# Patient Record
Sex: Male | Born: 1980 | Race: White | Hispanic: No | Marital: Married | Smoking: Never smoker
Health system: Southern US, Community
[De-identification: ages and names within clinical notes are randomized; demographics above are authoritative.]

## PROBLEM LIST (undated history)

## (undated) DIAGNOSIS — IMO0002 Reserved for concepts with insufficient information to code with codable children: Secondary | ICD-10-CM

## (undated) DIAGNOSIS — K219 Gastro-esophageal reflux disease without esophagitis: Secondary | ICD-10-CM

## (undated) DIAGNOSIS — I10 Essential (primary) hypertension: Secondary | ICD-10-CM

## (undated) HISTORY — DX: Gastro-esophageal reflux disease without esophagitis: K21.9

## (undated) HISTORY — DX: Essential (primary) hypertension: I10

## (undated) HISTORY — PX: OTHER SURGICAL HISTORY: SHX169

---

## 2011-07-29 ENCOUNTER — Encounter: Payer: Self-pay | Admitting: *Deleted

## 2013-12-14 ENCOUNTER — Encounter: Payer: Self-pay | Admitting: Cardiovascular Disease

## 2013-12-14 ENCOUNTER — Ambulatory Visit (INDEPENDENT_AMBULATORY_CARE_PROVIDER_SITE_OTHER): Payer: PRIVATE HEALTH INSURANCE | Admitting: Cardiovascular Disease

## 2013-12-14 VITALS — BP 138/82 | HR 89 | Ht 76.0 in | Wt 229.0 lb

## 2013-12-14 DIAGNOSIS — I10 Essential (primary) hypertension: Secondary | ICD-10-CM

## 2013-12-14 DIAGNOSIS — R0989 Other specified symptoms and signs involving the circulatory and respiratory systems: Secondary | ICD-10-CM

## 2013-12-14 DIAGNOSIS — R0600 Dyspnea, unspecified: Secondary | ICD-10-CM

## 2013-12-14 DIAGNOSIS — R072 Precordial pain: Secondary | ICD-10-CM

## 2013-12-14 DIAGNOSIS — R0609 Other forms of dyspnea: Secondary | ICD-10-CM

## 2013-12-14 NOTE — Patient Instructions (Signed)
Your physician recommends that you continue on your current medications as directed. Please refer to the Current Medication list given to you today.  Your physician has requested that you have an exercise tolerance test. For further information please visit [email or web address] Please also follow instruction sheet, as given.  Your physician has requested that you have an echocardiogram. Echocardiography is a painless test that uses sound waves to create images of your heart. It provides your doctor with information about the size and shape of your heart and how well your heart's chambers and valves are working. This procedure takes approximately one hour. There are no restrictions for this procedure.  Your physician recommends that you schedule a follow-up appointment in: 6-8 weeks with Dr Sanjuana Kava

## 2013-12-14 NOTE — Progress Notes (Signed)
     History of Present Illness: 33 yo male with history of HTN, GERD, and HLD here today as a new patient for evaluation of chest pain. He tells me that he had been feeling well until the beginning of July 2015. His BP was running high. He has been having pains in the center of his chest for the last 6 weeks. This occurs after meals and when lying in bed. He describes SOB when walking up stairs but no exertional chest pain. He has been started on a PPI with minimal improvement in chest pain. He has a strong family history of CAD in his father and grandfather.   Primary Care Physician: Larita Fife   Past Medical History  Diagnosis Date  . HTN (hypertension)   . GERD (gastroesophageal reflux disease)     Past Surgical History  Procedure Laterality Date  . None      Current Outpatient Prescriptions  Medication Sig Dispense Refill  . lisinopril-hydrochlorothiazide (ZESTORETIC) 20-12.5 MG per tablet Take 1 tablet by mouth.      Marland Kitchen omeprazole (PRILOSEC) 40 MG capsule Take 40 mg by mouth.      . zolpidem (AMBIEN) 10 MG tablet as needed.        No current facility-administered medications for this visit.    No Known Allergies  History   Social History  . Marital Status: Married    Spouse Name: N/A    Number of Children: 1  . Years of Education: N/A   Occupational History  . Sales    Social History Main Topics  . Smoking status: Never Smoker   . Smokeless tobacco: Not on file  . Alcohol Use: 8.4 oz/week    14 Cans of beer per week  . Drug Use: No  . Sexual Activity: Not on file   Other Topics Concern  . Not on file   Social History Narrative  . No narrative on file    Family History  Problem Relation Age of Onset  . Heart disease Father   . Heart attack Paternal Grandfather     Review of Systems:  As stated in the HPI and otherwise negative.   BP 138/82  Pulse 89  Ht  (1.93 m)  Wt 229 lb (103.874 kg)  BMI 27.89 kg/m2  SpO2 97%  Physical  Examination: General: Well developed, well nourished, NAD HEENT: OP clear, mucus membranes moist SKIN: warm, dry. No rashes. Neuro: No focal deficits Musculoskeletal: Muscle strength 5/5 all ext Psychiatric: Mood and affect normal Neck: No JVD, no carotid bruits, no thyromegaly, no lymphadenopathy. Lungs:Clear bilaterally, no wheezes, rhonci, crackles Cardiovascular: Regular rate and rhythm. No murmurs, gallops or rubs. Abdomen:Soft. Bowel sounds present. Non-tender.  Extremities: No lower extremity edema. Pulses are 2 + in the bilateral DP/PT.  EKG: NSR, rate 66 bpm.   Assessment and Plan:   1. Chest pain: He has a personal history of HTN and a strong family history of CAD. He is having daily chest pains that occur at rest and with exertion. He has had no improvement in his chest pain with addition of a proton pump inhibitor. Will arrange exercise stress myoview to exclude ischemia. Also having dyspnea with exertion (see below). If his cardiac workup is ok, will need referral to GI specialist.   2. Dyspnea: Lung exam clear. Will arrange echocardiogram to assess LV size and function, exclude valvular heart disease.   3. HTN: BP controlled on current therapy.

## 2013-12-17 ENCOUNTER — Ambulatory Visit (HOSPITAL_COMMUNITY): Payer: PRIVATE HEALTH INSURANCE | Attending: Cardiology

## 2013-12-17 DIAGNOSIS — R079 Chest pain, unspecified: Secondary | ICD-10-CM | POA: Diagnosis present

## 2013-12-17 DIAGNOSIS — E785 Hyperlipidemia, unspecified: Secondary | ICD-10-CM | POA: Diagnosis not present

## 2013-12-17 DIAGNOSIS — I10 Essential (primary) hypertension: Secondary | ICD-10-CM | POA: Diagnosis not present

## 2013-12-17 DIAGNOSIS — K219 Gastro-esophageal reflux disease without esophagitis: Secondary | ICD-10-CM | POA: Diagnosis not present

## 2013-12-17 DIAGNOSIS — R072 Precordial pain: Secondary | ICD-10-CM

## 2013-12-17 NOTE — Progress Notes (Signed)
2D Echo completed. 12/17/2013 

## 2013-12-20 ENCOUNTER — Telehealth: Payer: Self-pay | Admitting: *Deleted

## 2013-12-20 DIAGNOSIS — K219 Gastro-esophageal reflux disease without esophagitis: Secondary | ICD-10-CM

## 2013-12-20 NOTE — Telephone Encounter (Signed)
We can refer him to see Dr. Rhea Belton in [facility]. Thanks, chris

## 2013-12-20 NOTE — Telephone Encounter (Signed)
Spoke with pt and reviewed echo results with him. He states he talked with Dr. Clifton James about a GI referral. He is asking for recommendation and if we can make this referral for him.

## 2013-12-21 ENCOUNTER — Encounter: Payer: Self-pay | Admitting: Internal Medicine

## 2013-12-21 NOTE — Telephone Encounter (Signed)
Left message about referral on pt's identified voicemail. Left message to call back if questions. Referral put in EPIC.

## 2013-12-31 ENCOUNTER — Ambulatory Visit (INDEPENDENT_AMBULATORY_CARE_PROVIDER_SITE_OTHER): Payer: PRIVATE HEALTH INSURANCE

## 2013-12-31 DIAGNOSIS — R0609 Other forms of dyspnea: Secondary | ICD-10-CM

## 2013-12-31 DIAGNOSIS — R072 Precordial pain: Secondary | ICD-10-CM

## 2013-12-31 DIAGNOSIS — I10 Essential (primary) hypertension: Secondary | ICD-10-CM

## 2013-12-31 DIAGNOSIS — R0989 Other specified symptoms and signs involving the circulatory and respiratory systems: Secondary | ICD-10-CM

## 2013-12-31 DIAGNOSIS — R0600 Dyspnea, unspecified: Secondary | ICD-10-CM

## 2013-12-31 NOTE — Progress Notes (Signed)
Exercise Treadmill Test  Pre-Exercise Testing Evaluation Rhythm: normal sinus  Rate: 71 bpm     Test  Exercise Tolerance Test Ordering MD: Melene Muller, MD  Interpreting MD: Shea Evans, PA-C  Unique Test No: 1  Treadmill:  1  Indication for ETT: chest pain - rule out ischemia/SOB  Contraindication to ETT: No   Stress Modality: exercise - treadmill  Cardiac Imaging Performed: non   Protocol: standard Bruce - maximal  Max BP:  210/75  Max MPHR (bpm):  185 85% MPR (bpm):  157  MPHR obtained (bpm):  181 % MPHR obtained:  97  Reached 85% MPHR (min:sec):  7:15 Total Exercise Time (min-sec):  12:00  Workload in METS:  13.1 Borg Scale: 13  Reason ETT Terminated:  exaggerated hypertensive response    ST Segment Analysis At Rest: non-specific ST segment slurring With Exercise: no evidence of significant ST depression  Other Information Arrhythmia:  No significant arrhythmia- one PVC then one PVC couplet Angina during ETT:  absent (0) Quality of ETT:  diagnostic  ETT Interpretation:  normal - no evidence of ischemia by ST analysis  Comments: Normotensive at rest. Mild chest discomfort prior to test initiation. Patient is being treated for acid reflux. Excellent exercise capacity. Chest discomfort actually improved with exercise. No significant dyspnea. Test terminated due to persistence of elevated SBPs hovering in ~204-210 range. EKG without acute change.  Recommendations: ETT without evidence for ischemia. Excellent exercise capacity. Normotensive at rest before test (115/79). Hypertensive response to exercise, although this is the first time he has exercised in a month. Post-exercise blood pressure came back to normal quickly (127/77). Further plan per Dr. Clifton James.  Dayna Dunn PA-C

## 2014-02-17 ENCOUNTER — Ambulatory Visit: Payer: PRIVATE HEALTH INSURANCE | Admitting: Internal Medicine

## 2014-02-23 ENCOUNTER — Encounter: Payer: Self-pay | Admitting: Cardiovascular Disease

## 2014-02-23 ENCOUNTER — Ambulatory Visit (INDEPENDENT_AMBULATORY_CARE_PROVIDER_SITE_OTHER): Payer: PRIVATE HEALTH INSURANCE | Admitting: Cardiovascular Disease

## 2014-02-23 VITALS — BP 112/78 | HR 87 | Ht 76.0 in | Wt 231.2 lb

## 2014-02-23 DIAGNOSIS — R072 Precordial pain: Secondary | ICD-10-CM

## 2014-02-23 DIAGNOSIS — I10 Essential (primary) hypertension: Secondary | ICD-10-CM

## 2014-02-23 NOTE — Patient Instructions (Signed)
Your physician wants you to follow-up in:  12 months.  You will receive a reminder letter in the mail two months in advance. If you don't receive a letter, please call our office to schedule the follow-up appointment.   

## 2014-02-23 NOTE — Progress Notes (Signed)
History of Present Illness: 33 yo male with history of HTN, GERD, and HLD here today for cardiac follow up. I saw him as a new patient for evaluation of chest pain in August 2015. He tells me that he had been feeling well until the beginning of July 2015. His BP was running high. He described pains in the center of his chest that occurred after meals and when lying in bed. He described SOB when walking up stairs but no exertional chest pain. He has been started on a PPI with minimal improvement in chest pain. He has a strong family history of CAD in his father and grandfather. I arranged an echo and a treadmill stress test. Echo with normal LV function. Treadmill stress test with no evidence of ischemia after 12 minutes of exercise.   He is here today for follow up. He has been taking a PPI and feeling better. NO exertional chest pain.   Primary Care Physician: Larita Fifehan Badger   Past Medical History  Diagnosis Date  . HTN (hypertension)   . GERD (gastroesophageal reflux disease)     Past Surgical History  Procedure Laterality Date  . None      Current Outpatient Prescriptions  Medication Sig Dispense Refill  . DEXILANT 60 MG capsule 60 mg daily.   3  . lisinopril-hydrochlorothiazide (ZESTORETIC) 20-12.5 MG per tablet Take 1 tablet by mouth.    . ranitidine (ZANTAC) 150 MG capsule Take 150 mg by mouth 2 (two) times daily.     No current facility-administered medications for this visit.    No Known Allergies  History   Social History  . Marital Status: Married    Spouse Name: N/A    Number of Children: 1  . Years of Education: N/A   Occupational History  . Sales    Social History Main Topics  . Smoking status: Never Smoker   . Smokeless tobacco: Not on file  . Alcohol Use: 8.4 oz/week    14 Cans of beer per week  . Drug Use: No  . Sexual Activity: Not on file   Other Topics Concern  . Not on file   Social History Narrative    Family History  Problem Relation  Age of Onset  . Heart disease Father   . Heart attack Paternal Grandfather     Review of Systems:  As stated in the HPI and otherwise negative.   BP 112/78 mmHg  Pulse 87  Ht 6\' 4"  (1.93 m)  Wt 231 lb 3.2 oz (104.872 kg)  BMI 28.15 kg/m2  SpO2 96%  Physical Examination: General: Well developed, well nourished, NAD HEENT: OP clear, mucus membranes moist SKIN: warm, dry. No rashes. Neuro: No focal deficits Musculoskeletal: Muscle strength 5/5 all ext Psychiatric: Mood and affect normal Neck: No JVD, no carotid bruits, no thyromegaly, no lymphadenopathy. Lungs:Clear bilaterally, no wheezes, rhonci, crackles Cardiovascular: Regular rate and rhythm. No murmurs, gallops or rubs. Abdomen:Soft. Bowel sounds present. Non-tender.  Extremities: No lower extremity edema. Pulses are 2 + in the bilateral DP/PT.  Echo 12/17/13: Left ventricle: The cavity size was normal. Systolic function was normal. The estimated ejection fraction was in the range of 60% to 65%. Wall motion was normal; there were no regional wall motion abnormalities. - Pulmonary arteries: PA peak pressure: 35 mm Hg (S).  Assessment and Plan:   1. Chest pain: Atypical. Likely related to GERD. Stress test without ischemia. No further workup at this time.    2.  HTN: BP controlled on current therapy. No changes.

## 2014-03-29 ENCOUNTER — Encounter: Payer: Self-pay | Admitting: Internal Medicine

## 2014-05-18 ENCOUNTER — Ambulatory Visit (INDEPENDENT_AMBULATORY_CARE_PROVIDER_SITE_OTHER): Payer: PRIVATE HEALTH INSURANCE | Admitting: Internal Medicine

## 2014-05-18 ENCOUNTER — Encounter: Payer: Self-pay | Admitting: Internal Medicine

## 2014-05-18 VITALS — BP 132/84 | HR 80 | Ht 76.0 in | Wt 253.0 lb

## 2014-05-18 DIAGNOSIS — K219 Gastro-esophageal reflux disease without esophagitis: Secondary | ICD-10-CM

## 2014-05-18 DIAGNOSIS — R0789 Other chest pain: Secondary | ICD-10-CM

## 2014-05-18 NOTE — Progress Notes (Signed)
HISTORY OF PRESENT ILLNESS:  Edward Stanley is a 34 y.o. male who sent today by his primary provider regarding problems with chest complaints and acid reflux. The patient reports to me that he was in his usual state of good health until July 2015 when he developed problems with lightheadedness. He is known to be hypertensive. He required adjustment in his lisinopril for elevated blood pressure at that time. He subsequently began to notice pronounced throbbing of his heartbeat while laying night. He states "felt like my heart was coming out of my chest". He can also here the throbbing in his ear when lying on the pillow at night. He reports having been under stress at that time. In August he underwent cardiac evaluation which was negative. He was felt to have acid reflux and has been on some form of PPI since. For months, PPI did not help symptoms. Most recently spent on Dexilant 60 mg daily and ranitidine 150 mg at night. He states that his symptoms resolved. He recently ran out of Dexilant and 2 days later began to notice epigastric burning most pronounced after meals and at night. No recurrence of the chest pounding. In retrospect, he does state that he had minor symptoms of similar burning though less pronounced. He denies dysphagia, weight loss, or true abdominal pain. GI review of systems otherwise negative. He has refilled his Dexilant prescription  REVIEW OF SYSTEMS:  All non-GI ROS negative except for sleeping problems  Past Medical History  Diagnosis Date  . HTN (hypertension)   . GERD (gastroesophageal reflux disease)   . Prostate cancer   . Hyperlipidemia   . Anemia   . Encephalopathy   . Cirrhosis   . Cholelithiasis   . Osteoarthritis   . COPD (chronic obstructive pulmonary disease)   . STEMI (ST elevation myocardial infarction)     Past Surgical History  Procedure Laterality Date  . None      Social History Edward CumminsBradley Heckart  reports that he has never smoked. He does not have any  smokeless tobacco history on file. He reports that he drinks about 8.4 oz of alcohol per week. He reports that he does not use illicit drugs.  family history includes Breast cancer in his paternal grandmother; Heart attack in his paternal grandfather; Heart disease in his father; Prostate cancer in his paternal grandfather.  No Known Allergies     PHYSICAL EXAMINATION: Vital signs: BP 132/84 mmHg  Pulse 80  Ht 6\' 4"  (1.93 m)  Wt 253 lb (114.76 kg)  BMI 30.81 kg/m2 General: Well-developed, well-nourished, no acute distress HEENT: Sclerae are anicteric, conjunctiva pink. Oral mucosa intact Lungs: Clear Heart: Regular Abdomen: soft, nontender, nondistended, no obvious ascites, no peritoneal signs, normal bowel sounds. No organomegaly. Extremities: No edema Psychiatric: alert and oriented x3. Cooperative   ASSESSMENT:  #1. Initial complaints of chest pounding/throbbing. Noncardiac. Not reflux. #2. Probable underlying mild reflux based on history. More pronounced symptoms after withdrawal of prolonged PPI suggestive of acid rebound   PLAN:  #1. I discussed the above impression and detail #2. Interested in finding the lowest dose of medication to control true reflux symptoms. I've asked him to discontinue Zantac. He can alternate Dexilant 60 mg daily with Prilosec OTC 20 mg daily. Hopefully he can transition to Prilosec OTC 20 mg daily. Further reduction of acid suppression medication as tolerated. Maybe, that he does require some dose of acid suppression to control underlying reflux symptoms. No indication for endoscopy at this time. #3. Return to  clinic in 3 months for reevaluation. Contact the office in the interim for any questions or problems.

## 2014-05-18 NOTE — Patient Instructions (Signed)
Continue your PPI, give us a call to let us know how you are doing.  Please follow up with Dr. Marina GoodellPerry in 3 months

## 2014-07-04 ENCOUNTER — Other Ambulatory Visit (HOSPITAL_COMMUNITY): Payer: Self-pay | Admitting: Physician Assistant

## 2014-07-04 DIAGNOSIS — K219 Gastro-esophageal reflux disease without esophagitis: Secondary | ICD-10-CM

## 2014-07-11 ENCOUNTER — Ambulatory Visit (HOSPITAL_COMMUNITY)
Admission: RE | Admit: 2014-07-11 | Discharge: 2014-07-11 | Disposition: A | Discharge status: Home / Self Care | Payer: PRIVATE HEALTH INSURANCE | Source: Ambulatory Visit | Attending: Physician Assistant | Admitting: Physician Assistant

## 2014-07-11 DIAGNOSIS — K219 Gastro-esophageal reflux disease without esophagitis: Secondary | ICD-10-CM | POA: Diagnosis present

## 2014-07-11 DIAGNOSIS — K210 Gastro-esophageal reflux disease with esophagitis: Secondary | ICD-10-CM | POA: Insufficient documentation

## 2014-08-19 ENCOUNTER — Observation Stay (HOSPITAL_COMMUNITY): Payer: PRIVATE HEALTH INSURANCE | Admitting: Certified Registered"

## 2014-08-19 ENCOUNTER — Encounter (HOSPITAL_COMMUNITY): Admission: EM | Disposition: A | Discharge status: Home / Self Care | Payer: Self-pay | Source: Home / Self Care

## 2014-08-19 ENCOUNTER — Observation Stay (HOSPITAL_COMMUNITY)
Admission: EM | Admit: 2014-08-19 | Discharge: 2014-08-19 | Disposition: A | Discharge status: Home / Self Care | Payer: PRIVATE HEALTH INSURANCE | Attending: Surgery | Admitting: Surgery

## 2014-08-19 ENCOUNTER — Encounter (HOSPITAL_COMMUNITY): Payer: Self-pay | Admitting: Emergency Medicine

## 2014-08-19 ENCOUNTER — Emergency Department (HOSPITAL_COMMUNITY): Payer: PRIVATE HEALTH INSURANCE

## 2014-08-19 DIAGNOSIS — K219 Gastro-esophageal reflux disease without esophagitis: Secondary | ICD-10-CM | POA: Diagnosis not present

## 2014-08-19 DIAGNOSIS — R1031 Right lower quadrant pain: Secondary | ICD-10-CM

## 2014-08-19 DIAGNOSIS — K3580 Unspecified acute appendicitis: Principal | ICD-10-CM | POA: Insufficient documentation

## 2014-08-19 DIAGNOSIS — K37 Unspecified appendicitis: Secondary | ICD-10-CM | POA: Diagnosis present

## 2014-08-19 DIAGNOSIS — I10 Essential (primary) hypertension: Secondary | ICD-10-CM | POA: Diagnosis not present

## 2014-08-19 HISTORY — PX: LAPAROSCOPIC APPENDECTOMY: SHX408

## 2014-08-19 LAB — CBC WITH DIFFERENTIAL/PLATELET
Basophils Absolute: 0 10*3/uL (ref 0.0–0.1)
Basophils Relative: 0 % (ref 0–1)
Eosinophils Absolute: 0.1 10*3/uL (ref 0.0–0.7)
Eosinophils Relative: 1 % (ref 0–5)
HCT: 39.2 % (ref 39.0–52.0)
Hemoglobin: 13.7 g/dL (ref 13.0–17.0)
Lymphocytes Relative: 24 % (ref 12–46)
Lymphs Abs: 1.9 10*3/uL (ref 0.7–4.0)
MCH: 29.2 pg (ref 26.0–34.0)
MCHC: 34.9 g/dL (ref 30.0–36.0)
MCV: 83.6 fL (ref 78.0–100.0)
MONO ABS: 0.7 10*3/uL (ref 0.1–1.0)
Monocytes Relative: 8 % (ref 3–12)
Neutro Abs: 5.2 10*3/uL (ref 1.7–7.7)
Neutrophils Relative %: 67 % (ref 43–77)
Platelets: 192 10*3/uL (ref 150–400)
RBC: 4.69 MIL/uL (ref 4.22–5.81)
RDW: 12.3 % (ref 11.5–15.5)
WBC: 7.8 10*3/uL (ref 4.0–10.5)

## 2014-08-19 LAB — COMPREHENSIVE METABOLIC PANEL
ALK PHOS: 60 U/L (ref 39–117)
ALT: 25 U/L (ref 0–53)
AST: 20 U/L (ref 0–37)
Albumin: 4.2 g/dL (ref 3.5–5.2)
Anion gap: 10 (ref 5–15)
BUN: 20 mg/dL (ref 6–23)
CALCIUM: 9.3 mg/dL (ref 8.4–10.5)
CO2: 24 mmol/L (ref 19–32)
Chloride: 104 mmol/L (ref 96–112)
Creatinine, Ser: 0.95 mg/dL (ref 0.50–1.35)
GFR calc Af Amer: >90 mL/min (ref >90)
Glucose, Bld: 109 mg/dL — ABNORMAL HIGH (ref 70–99)
Potassium: 3.9 mmol/L (ref 3.5–5.1)
SODIUM: 138 mmol/L (ref 135–145)
Total Bilirubin: 0.8 mg/dL (ref 0.3–1.2)
Total Protein: 6.8 g/dL (ref 6.0–8.3)

## 2014-08-19 SURGERY — APPENDECTOMY, LAPAROSCOPIC
Anesthesia: General | Site: Abdomen

## 2014-08-19 MED ORDER — ONDANSETRON HCL 4 MG/2ML IJ SOLN
INTRAMUSCULAR | Status: AC
  Filled 2014-08-19: qty 2

## 2014-08-19 MED ORDER — ENOXAPARIN SODIUM 40 MG/0.4ML ~~LOC~~ SOLN: 40.0000 mg | SUBCUTANEOUS | Status: DC

## 2014-08-19 MED ORDER — GLYCOPYRROLATE 0.2 MG/ML IJ SOLN
INTRAMUSCULAR | Status: AC
  Filled 2014-08-19: qty 2

## 2014-08-19 MED ORDER — HYDROMORPHONE HCL 1 MG/ML IJ SOLN
0.2500 mg | INTRAMUSCULAR | Status: DC | PRN
  Administered 2014-08-19 (×4): 0.5 mg via INTRAVENOUS

## 2014-08-19 MED ORDER — KCL IN DEXTROSE-NACL 20-5-0.9 MEQ/L-%-% IV SOLN
INTRAVENOUS | Status: DC
  Administered 2014-08-19: 12:00:00 via INTRAVENOUS
  Filled 2014-08-19 (×3): qty 1000

## 2014-08-19 MED ORDER — IOHEXOL 300 MG/ML  SOLN
25.0000 mL | Freq: Once | INTRAMUSCULAR | Status: AC | PRN
  Administered 2014-08-19: 25 mL via ORAL

## 2014-08-19 MED ORDER — HYDROCODONE-ACETAMINOPHEN 5-325 MG PO TABS
1.0000 | ORAL_TABLET | ORAL | Status: DC | PRN
  Administered 2014-08-19 (×2): 2 via ORAL
  Filled 2014-08-19 (×2): qty 2

## 2014-08-19 MED ORDER — ROCURONIUM BROMIDE 100 MG/10ML IV SOLN
INTRAVENOUS | Status: DC | PRN
  Administered 2014-08-19: 30 mg via INTRAVENOUS

## 2014-08-19 MED ORDER — MIDAZOLAM HCL 2 MG/2ML IJ SOLN
INTRAMUSCULAR | Status: AC
  Filled 2014-08-19: qty 2

## 2014-08-19 MED ORDER — LIDOCAINE HCL (CARDIAC) 20 MG/ML IV SOLN
INTRAVENOUS | Status: DC | PRN
  Administered 2014-08-19: 80 mg via INTRAVENOUS

## 2014-08-19 MED ORDER — LISINOPRIL 20 MG PO TABS
20.0000 mg | ORAL_TABLET | Freq: Every day | ORAL | Status: DC
  Filled 2014-08-19: qty 1

## 2014-08-19 MED ORDER — NEOSTIGMINE METHYLSULFATE 10 MG/10ML IV SOLN
INTRAVENOUS | Status: AC
  Filled 2014-08-19: qty 1

## 2014-08-19 MED ORDER — PROPOFOL 10 MG/ML IV BOLUS
INTRAVENOUS | Status: DC | PRN
  Administered 2014-08-19: 180 mg via INTRAVENOUS
  Administered 2014-08-19: 20 mg via INTRAVENOUS

## 2014-08-19 MED ORDER — PANTOPRAZOLE SODIUM 40 MG PO TBEC
40.0000 mg | DELAYED_RELEASE_TABLET | Freq: Every day | ORAL | Status: DC
  Administered 2014-08-19: 40 mg via ORAL
  Filled 2014-08-19 (×2): qty 1

## 2014-08-19 MED ORDER — FENTANYL CITRATE (PF) 250 MCG/5ML IJ SOLN
INTRAMUSCULAR | Status: AC
  Filled 2014-08-19: qty 5

## 2014-08-19 MED ORDER — HYDROMORPHONE HCL 1 MG/ML IJ SOLN
INTRAMUSCULAR | Status: AC
  Filled 2014-08-19: qty 1

## 2014-08-19 MED ORDER — NEOSTIGMINE METHYLSULFATE 10 MG/10ML IV SOLN
INTRAVENOUS | Status: DC | PRN
  Administered 2014-08-19: 3 mg via INTRAVENOUS

## 2014-08-19 MED ORDER — DEXAMETHASONE SODIUM PHOSPHATE 4 MG/ML IJ SOLN
INTRAMUSCULAR | Status: AC
  Filled 2014-08-19: qty 2

## 2014-08-19 MED ORDER — LISINOPRIL-HYDROCHLOROTHIAZIDE 20-12.5 MG PO TABS: 1.0000 | ORAL_TABLET | Freq: Every day | ORAL | Status: DC

## 2014-08-19 MED ORDER — MIDAZOLAM HCL 5 MG/5ML IJ SOLN
INTRAMUSCULAR | Status: DC | PRN
  Administered 2014-08-19: 2 mg via INTRAVENOUS

## 2014-08-19 MED ORDER — LIDOCAINE HCL (CARDIAC) 20 MG/ML IV SOLN
INTRAVENOUS | Status: AC
Start: 2014-08-19 — End: 2014-08-19
  Filled 2014-08-19: qty 5

## 2014-08-19 MED ORDER — BUPIVACAINE-EPINEPHRINE 0.25% -1:200000 IJ SOLN
INTRAMUSCULAR | Status: DC | PRN
  Administered 2014-08-19: 6 mL

## 2014-08-19 MED ORDER — FAMOTIDINE 20 MG PO TABS
20.0000 mg | ORAL_TABLET | Freq: Every day | ORAL | Status: DC | PRN
Start: 2014-08-19 — End: 2014-08-19

## 2014-08-19 MED ORDER — IOHEXOL 300 MG/ML  SOLN
80.0000 mL | Freq: Once | INTRAMUSCULAR | Status: AC | PRN
  Administered 2014-08-19: 80 mL via INTRAVENOUS

## 2014-08-19 MED ORDER — SUCCINYLCHOLINE CHLORIDE 20 MG/ML IJ SOLN
INTRAMUSCULAR | Status: DC | PRN
  Administered 2014-08-19: 40 mg via INTRAVENOUS

## 2014-08-19 MED ORDER — ACETAMINOPHEN 325 MG PO TABS
650.0000 mg | ORAL_TABLET | Freq: Four times a day (QID) | ORAL | Status: DC | PRN
Start: 2014-08-19 — End: 2014-08-19

## 2014-08-19 MED ORDER — METRONIDAZOLE IN NACL 5-0.79 MG/ML-% IV SOLN
500.0000 mg | Freq: Three times a day (TID) | INTRAVENOUS | Status: DC
  Administered 2014-08-19: 500 mg via INTRAVENOUS
  Filled 2014-08-19 (×3): qty 100

## 2014-08-19 MED ORDER — GLYCOPYRROLATE 0.2 MG/ML IJ SOLN
INTRAMUSCULAR | Status: DC | PRN
  Administered 2014-08-19: 0.4 mg via INTRAVENOUS

## 2014-08-19 MED ORDER — HYDROCODONE-ACETAMINOPHEN 5-325 MG PO TABS: 1.0000 | ORAL_TABLET | Freq: Four times a day (QID) | ORAL | Status: DC | PRN

## 2014-08-19 MED ORDER — LACTATED RINGERS IV SOLN
INTRAVENOUS | Status: DC | PRN
  Administered 2014-08-19 (×2): via INTRAVENOUS

## 2014-08-19 MED ORDER — SODIUM CHLORIDE 0.9 % IR SOLN
Status: DC | PRN
  Administered 2014-08-19: 1000 mL

## 2014-08-19 MED ORDER — PROPOFOL 10 MG/ML IV BOLUS
INTRAVENOUS | Status: AC
  Filled 2014-08-19: qty 20

## 2014-08-19 MED ORDER — BUPIVACAINE-EPINEPHRINE (PF) 0.25% -1:200000 IJ SOLN
INTRAMUSCULAR | Status: AC
  Filled 2014-08-19: qty 30

## 2014-08-19 MED ORDER — ACETAMINOPHEN 650 MG RE SUPP: 650.0000 mg | Freq: Four times a day (QID) | RECTAL | Status: DC | PRN

## 2014-08-19 MED ORDER — DEXAMETHASONE SODIUM PHOSPHATE 4 MG/ML IJ SOLN
INTRAMUSCULAR | Status: DC | PRN
  Administered 2014-08-19: 8 mg via INTRAVENOUS

## 2014-08-19 MED ORDER — ONDANSETRON HCL 4 MG/2ML IJ SOLN
INTRAMUSCULAR | Status: DC | PRN
  Administered 2014-08-19: 4 mg via INTRAVENOUS

## 2014-08-19 MED ORDER — FENTANYL CITRATE (PF) 100 MCG/2ML IJ SOLN
INTRAMUSCULAR | Status: DC | PRN
  Administered 2014-08-19 (×2): 50 ug via INTRAVENOUS
  Administered 2014-08-19: 150 ug via INTRAVENOUS

## 2014-08-19 MED ORDER — DEXTROSE 5 % IV SOLN
2.0000 g | INTRAVENOUS | Status: DC
  Administered 2014-08-19: 2 g via INTRAVENOUS
  Filled 2014-08-19: qty 2

## 2014-08-19 MED ORDER — HYDROCHLOROTHIAZIDE 12.5 MG PO CAPS
12.5000 mg | ORAL_CAPSULE | Freq: Every day | ORAL | Status: DC
  Filled 2014-08-19: qty 1

## 2014-08-19 MED ORDER — ONDANSETRON HCL 4 MG/2ML IJ SOLN: 4.0000 mg | Freq: Four times a day (QID) | INTRAMUSCULAR | Status: DC | PRN

## 2014-08-19 MED ORDER — 0.9 % SODIUM CHLORIDE (POUR BTL) OPTIME
TOPICAL | Status: DC | PRN
  Administered 2014-08-19: 1000 mL

## 2014-08-19 MED ORDER — MORPHINE SULFATE 2 MG/ML IJ SOLN
2.0000 mg | INTRAMUSCULAR | Status: DC | PRN
Start: 2014-08-19 — End: 2014-08-19

## 2014-08-19 SURGICAL SUPPLY — 43 items
APPLIER CLIP ROT 10 11.4 M/L (STAPLE)
BENZOIN TINCTURE PRP APPL 2/3 (GAUZE/BANDAGES/DRESSINGS) ×3 IMPLANT
BLADE SURG ROTATE 9660 (MISCELLANEOUS) ×3 IMPLANT
CANISTER SUCTION 2500CC (MISCELLANEOUS) ×3 IMPLANT
CHLORAPREP W/TINT 26ML (MISCELLANEOUS) ×3 IMPLANT
CLIP APPLIE ROT 10 11.4 M/L (STAPLE) IMPLANT
CLOSURE WOUND 1/2 X4 (GAUZE/BANDAGES/DRESSINGS) ×1
COVER SURGICAL LIGHT HANDLE (MISCELLANEOUS) ×3 IMPLANT
CUTTER FLEX LINEAR 45M (STAPLE) ×3 IMPLANT
DRAPE LAPAROSCOPIC ABDOMINAL (DRAPES) ×3 IMPLANT
DRSG TEGADERM 2-3/8X2-3/4 SM (GAUZE/BANDAGES/DRESSINGS) ×6 IMPLANT
DRSG TEGADERM 4X4.75 (GAUZE/BANDAGES/DRESSINGS) ×3 IMPLANT
ELECT REM PT RETURN 9FT ADLT (ELECTROSURGICAL) ×3
ELECTRODE REM PT RTRN 9FT ADLT (ELECTROSURGICAL) ×1 IMPLANT
ENDOLOOP SUT PDS II  0 18 (SUTURE)
ENDOLOOP SUT PDS II 0 18 (SUTURE) IMPLANT
FILTER SMOKE EVAC LAPAROSHD (FILTER) ×3 IMPLANT
GAUZE SPONGE 2X2 8PLY STRL LF (GAUZE/BANDAGES/DRESSINGS) ×1 IMPLANT
GLOVE BIO SURGEON STRL SZ7 (GLOVE) ×3 IMPLANT
GLOVE BIOGEL PI IND STRL 7.5 (GLOVE) ×1 IMPLANT
GLOVE BIOGEL PI INDICATOR 7.5 (GLOVE) ×2
GOWN STRL REUS W/ TWL LRG LVL3 (GOWN DISPOSABLE) ×2 IMPLANT
GOWN STRL REUS W/ TWL XL LVL3 (GOWN DISPOSABLE) ×1 IMPLANT
GOWN STRL REUS W/TWL LRG LVL3 (GOWN DISPOSABLE) ×4
GOWN STRL REUS W/TWL XL LVL3 (GOWN DISPOSABLE) ×2
KIT BASIN OR (CUSTOM PROCEDURE TRAY) ×3 IMPLANT
KIT ROOM TURNOVER OR (KITS) ×3 IMPLANT
NS IRRIG 1000ML POUR BTL (IV SOLUTION) ×3 IMPLANT
PAD ARMBOARD 7.5X6 YLW CONV (MISCELLANEOUS) ×3 IMPLANT
POUCH SPECIMEN RETRIEVAL 10MM (ENDOMECHANICALS) ×3 IMPLANT
RELOAD STAPLE TA45 3.5 REG BLU (ENDOMECHANICALS) ×3 IMPLANT
SCALPEL HARMONIC ACE (MISCELLANEOUS) ×3 IMPLANT
SET IRRIG TUBING LAPAROSCOPIC (IRRIGATION / IRRIGATOR) ×3 IMPLANT
SPECIMEN JAR SMALL (MISCELLANEOUS) ×3 IMPLANT
SPONGE GAUZE 2X2 STER 10/PKG (GAUZE/BANDAGES/DRESSINGS) ×2
STRIP CLOSURE SKIN 1/2X4 (GAUZE/BANDAGES/DRESSINGS) ×2 IMPLANT
SUT MNCRL AB 4-0 PS2 18 (SUTURE) ×3 IMPLANT
TOWEL OR 17X24 6PK STRL BLUE (TOWEL DISPOSABLE) ×3 IMPLANT
TOWEL OR 17X26 10 PK STRL BLUE (TOWEL DISPOSABLE) ×3 IMPLANT
TRAY LAPAROSCOPIC (CUSTOM PROCEDURE TRAY) ×3 IMPLANT
TROCAR XCEL BLADELESS 5X75MML (TROCAR) ×6 IMPLANT
TROCAR XCEL BLUNT TIP 100MML (ENDOMECHANICALS) ×3 IMPLANT
TUBING INSUFFLATION (TUBING) ×3 IMPLANT

## 2014-08-19 NOTE — Op Note (Signed)
Appendectomy, Lap, Procedure Note  Indications: The patient presented with a one-day history of right-sided abdominal pain. A CT scan revealed findings consistent with acute appendicitis.  Pre-operative Diagnosis: Acute appendicitis without mention of peritonitis  Post-operative Diagnosis: Same  Surgeon: Avelynn Sellin K.   Assistants: none  Anesthesia: General endotracheal anesthesia  ASA Class: 1E  Procedure Details  The patient was seen again in the Holding Room. The risks, benefits, complications, treatment options, and expected outcomes were discussed with the patient and/or family. The possibilities of reaction to medication, perforation of viscus, bleeding, recurrent infection, finding a normal appendix, the need for additional procedures, failure to diagnose a condition, and creating a complication requiring transfusion or operation were discussed. There was concurrence with the proposed plan and informed consent was obtained. The site of surgery was properly noted. The patient was taken to Operating Room, identified as Verdon CumminsBradley Alldredge and the procedure verified as Appendectomy. A Time Out was held and the above information confirmed.  The patient was placed in the supine position and general anesthesia was induced.  The abdomen was prepped and draped in a sterile fashion. A one centimeter infraumbilical incision was made.  Dissection was carried down to the fascia bluntly.  The fascia was incised vertically.  We entered the peritoneal cavity bluntly.  A pursestring suture was passed around the incision with a 0 Vicryl.  The Hasson cannula was introduced into the abdomen and the tails of the suture were used to hold the Hasson in place.   The pneumoperitoneum was then established maintaining a maximum pressure of 15 mmHg.  Additional 5 mm cannulas then placed in the left lower quadrant of the abdomen and the right upper quadrant under direct visualization. A careful evaluation of the entire  abdomen was carried out. The patient was placed in Trendelenburg and left lateral decubitus position.  The scope was moved to the right upper quadrant port site. The cecum was mobilized medially.  The appendix was inflamed and thickened, but there was no sign of perforation or abscess.  The appendix was carefully dissected. The appendix was was skeletonized with the harmonic scalpel.   The appendix was divided at its base using an endo-GIA stapler. Minimal appendiceal stump was left in place. There was no evidence of bleeding, leakage, or complication after division of the appendix. Irrigation was also performed and irrigate suctioned from the abdomen as well.  The umbilical port site was closed with the purse string suture. There was no residual palpable fascial defect.  The trocar site skin wounds were closed with 4-0 Monocryl.  Instrument, sponge, and needle counts were correct at the conclusion of the case.   Findings: The appendix was found to be inflamed. There were not signs of necrosis.  There was not perforation. There was not abscess formation.  Estimated Blood Loss:  Minimal         Drains: none         Specimens: appendix         Complications:  None; patient tolerated the procedure well.         Disposition: PACU - hemodynamically stable.         Condition: stable  Wilmon ArmsMatthew K. Corliss Skainssuei, MD, [facility]  General/ Trauma Surgery  08/19/2014 2:22 PM

## 2014-08-19 NOTE — Anesthesia Postprocedure Evaluation (Signed)
  Anesthesia Post-op Note  Patient: Edward Stanley  Procedure(s) Performed: Procedure(s): APPENDECTOMY LAPAROSCOPIC (N/A)  Patient Location: PACU  Anesthesia Type:General  Level of Consciousness: awake  Airway and Oxygen Therapy: Patient Spontanous Breathing  Post-op Pain: mild  Post-op Assessment: Post-op Vital signs reviewed, Patient's Cardiovascular Status Stable, Respiratory Function Stable, Patent Airway, No signs of Nausea or vomiting and Pain level controlled  Post-op Vital Signs: Reviewed and stable  Last Vitals:  Filed Vitals:   08/19/14 1557  BP: 126/71  Pulse: 61  Temp: 36.9 C  Resp: 15    Complications: No apparent anesthesia complications

## 2014-08-19 NOTE — Anesthesia Preprocedure Evaluation (Addendum)
Anesthesia Evaluation  Patient identified by MRN, date of birth, ID band Patient awake    Reviewed: Allergy & Precautions, H&P , NPO status , Patient's Chart, lab work & pertinent test results  Airway Mallampati: II  TM Distance: >3 FB Neck ROM: Full    Dental no notable dental hx. (+) Teeth Intact, Dental Advisory Given   Pulmonary neg pulmonary ROS,  breath sounds clear to auscultation  Pulmonary exam normal       Cardiovascular hypertension, Pt. on medications Rhythm:Regular Rate:Normal     Neuro/Psych negative neurological ROS  negative psych ROS   GI/Hepatic Neg liver ROS, GERD-  Medicated and Controlled,  Endo/Other  negative endocrine ROS  Renal/GU negative Renal ROS  negative genitourinary   Musculoskeletal   Abdominal   Peds  Hematology negative hematology ROS (+)   Anesthesia Other Findings   Reproductive/Obstetrics negative OB ROS                            Anesthesia Physical Anesthesia Plan  ASA: II and emergent  Anesthesia Plan: General   Post-op Pain Management:    Induction: Intravenous, Rapid sequence and Cricoid pressure planned  Airway Management Planned: Oral ETT  Additional Equipment:   Intra-op Plan:   Post-operative Plan: Extubation in OR  Informed Consent: I have reviewed the patients History and Physical, chart, labs and discussed the procedure including the risks, benefits and alternatives for the proposed anesthesia with the patient or authorized representative who has indicated his/her understanding and acceptance.   Dental advisory given  Plan Discussed with: CRNA  Anesthesia Plan Comments:         Anesthesia Quick Evaluation

## 2014-08-19 NOTE — Progress Notes (Signed)
Family has all belongings.  

## 2014-08-19 NOTE — ED Provider Notes (Signed)
CSN: [id number]     Arrival date & time 08/19/14  0708 History   None    Chief Complaint  Patient presents with  . Abdominal Pain  . Emesis     (Consider location/radiation/quality/duration/timing/severity/associated sxs/prior Treatment) Patient is a 34 y.o. male presenting with abdominal pain and vomiting. The history is provided by the patient. No language interpreter was used.  Abdominal Pain Pain location:  RLQ Pain quality: aching   Pain radiates to:  Does not radiate Pain severity:  Moderate Onset quality:  Gradual Duration:  1 day Timing:  Constant Progression:  Worsening Chronicity:  New Context: not recent illness   Relieved by:  Nothing Worsened by:  Nothing tried Ineffective treatments:  None tried Associated symptoms: vomiting   Associated symptoms: no fever and no nausea   Risk factors: has not had multiple surgeries and no recent hospitalization   Emesis Associated symptoms: abdominal pain   Pt reports sorenss right lower quadrant.  Pt woried that he has appendicitis   Pt's medical and social history that pulled up from old note is incorrect.    This pt has a history of GERD and HTN only  Past Surgical History  Procedure Laterality Date  . None     Family History  Problem Relation Age of Onset  . Heart disease Father   . Heart attack Paternal Grandfather   . Breast cancer Paternal Grandmother   . Prostate cancer Paternal Grandfather      Review of Systems  Constitutional: Negative for fever.  Gastrointestinal: Positive for vomiting and abdominal pain. Negative for nausea.  All other systems reviewed and are negative.     Allergies  Review of patient's allergies indicates no known allergies.  Home Medications   Prior to Admission medications   Medication Sig Start Date End Date Taking? Authorizing Provider  DEXILANT 60 MG capsule Take 60 mg by mouth daily.  02/10/14  Yes Historical Provider, MD  lisinopril-hydrochlorothiazide (ZESTORETIC)  20-12.5 MG per tablet Take 1 tablet by mouth. 11/23/13 11/23/14 Yes Historical Provider, MD  ranitidine (ZANTAC) 150 MG capsule Take 150 mg by mouth daily as needed for heartburn.    Yes Historical Provider, MD   BP 117/71 mmHg  Pulse 59  Temp(Src) 97.9 F (36.6 C) (Oral)  Resp 18  Ht 6' 3.5" (1.918 m)  Wt 245 lb (111.131 kg)  BMI 30.21 kg/m2  SpO2 100% Physical Exam  Constitutional: He is oriented to person, place, and time. He appears well-developed and well-nourished.  HENT:  Head: Normocephalic and atraumatic.  Right Ear: External ear normal.  Left Ear: External ear normal.  Mouth/Throat: Oropharynx is clear and moist.  Eyes: Pupils are equal, round, and reactive to light.  Neck: Normal range of motion.  Cardiovascular: Normal rate.   Pulmonary/Chest: Effort normal.  Abdominal: Soft. There is tenderness. There is no rebound and no guarding.  Musculoskeletal: Normal range of motion.  Neurological: He is alert and oriented to person, place, and time.  Skin: Skin is warm.  Psychiatric: He has a normal mood and affect.  Vitals reviewed.   ED Course  Procedures (including critical care time) Labs Review Labs Reviewed  COMPREHENSIVE METABOLIC PANEL - Abnormal; Notable for the following:    Glucose, Bld 109 (*)    All other components within normal limits  CBC WITH DIFFERENTIAL/PLATELET    Imaging Review Ct Abdomen Pelvis W Contrast  08/19/2014   CLINICAL DATA:  Right lower quadrant abdominal pain with vomiting beginning yesterday. Low back  pain. Gastroesophageal reflux.  EXAM: CT ABDOMEN AND PELVIS WITH CONTRAST  TECHNIQUE: Multidetector CT imaging of the abdomen and pelvis was performed using the standard protocol following bolus administration of intravenous contrast.  CONTRAST:  80mL OMNIPAQUE IOHEXOL 300 MG/ML  SOLN  COMPARISON:  None.  FINDINGS: Minimal dependent atelectasis is present in the lung bases. No pleural effusion.  The liver, gallbladder, spleen, adrenal glands,  kidneys, and pancreas have an unremarkable enhanced appearance.  Oral contrast is present in nondilated loops of distal small bowel and large bowel to the level of the ascending colon. There is no evidence of obstruction. Appendix is mildly enlarged near its base, measuring 10 mm in diameter, with evidence of mild wall thickening, hyperenhancement, and very mild periappendiceal inflammatory changes. Appendix is retrocecal in location. There is no evidence of perforation or fluid collection. There are an increased number of small right lower quadrant lymph nodes measuring up to 5 mm in short axis.  Bladder is unremarkable. No intraperitoneal free fluid is seen. No acute osseous abnormality is identified.  IMPRESSION: Acute appendicitis without evidence of perforation.  These results were called by telephone at the time of interpretation on 08/19/2014 at 10:11 am to [facility], PA , who verbally acknowledged these results.   Electronically Signed   By: Sebastian Ache   On: 08/19/2014 10:12     EKG Interpretation None      MDM   Final diagnoses:  RLQ abdominal pain  Appendicitis, unspecified appendicitis type    I spoke to General Surgery who will see pt here for evaluation    Elson Areas, PA-C 08/19/14 [address] [place], PA-C 08/19/14 1049  Mancel Bale, MD 08/19/14 1615

## 2014-08-19 NOTE — Progress Notes (Signed)
Discharge home.Tolerated the diet, able to urinate without difficulty, oral pain managing pain. Home discharge instructions given,no questions verbalized.

## 2014-08-19 NOTE — Anesthesia Procedure Notes (Signed)
Procedure Name: Intubation Date/Time: 08/19/2014 1:41 PM Performed by: Jerilee HohMUMM, Josslynn Mentzer N Pre-anesthesia Checklist: Patient identified, Emergency Drugs available, Suction available and Patient being monitored Patient Re-evaluated:Patient Re-evaluated prior to inductionOxygen Delivery Method: Circle system utilized Preoxygenation: Pre-oxygenation with 100% oxygen Intubation Type: IV induction, Cricoid Pressure applied and Rapid sequence Ventilation: Mask ventilation without difficulty Laryngoscope Size: Mac and 4 Grade View: Grade I Tube type: Oral Tube size: 7.5 mm Number of attempts: 1 Airway Equipment and Method: Stylet Placement Confirmation: ETT inserted through vocal cords under direct vision,  positive ETCO2 and breath sounds checked- equal and bilateral Secured at: 23 cm Tube secured with: Tape Dental Injury: Teeth and Oropharynx as per pre-operative assessment

## 2014-08-19 NOTE — H&P (Signed)
Chief Complaint:  RLQ abdominal pain HPI: Edward Stanley is a 34 year old male with a history of HTN and GERD presenting with RLQ abdominal pain. He reports developing back pain over the weekend. Yesterday afternoon, he developed RLQ abdominal pain.  Associated with nausea and vomiting.  Time pattern is constant.  Aggravated by movement.  No alleviating factors.  Modifying factors include; tums.  Last oral intake was last night and then he vomited.  His pain is 6/10 now without any medication.  He denies fever, chills or sweats, diarrhea.  His work up shows a normal white count.  CT of abdomen and pelvis reveals acute appendicitis. We have therefore been asked to evaluate.    Past Medical History  Diagnosis Date  . HTN (hypertension)   . GERD (gastroesophageal reflux disease)     Past Surgical History  Procedure Laterality Date  . None      Family History  Problem Relation Age of Onset  . Heart disease Father   . Heart attack Paternal Grandfather   . Breast cancer Paternal Grandmother   . Prostate cancer Paternal Grandfather    Social History:  reports that he has never smoked. He does not have any smokeless tobacco history on file. He reports that he drinks about 8.4 oz of alcohol per week. He reports that he does not use illicit drugs.  Allergies: No Known Allergies  Medication: Prior to Admission medications   Medication Sig Start Date End Date Taking? Authorizing Provider  DEXILANT 60 MG capsule Take 60 mg by mouth daily.  02/10/14  Yes Historical Provider, MD  lisinopril-hydrochlorothiazide (ZESTORETIC) 20-12.5 MG per tablet Take 1 tablet by mouth. 11/23/13 11/23/14 Yes Historical Provider, MD  ranitidine (ZANTAC) 150 MG capsule Take 150 mg by mouth daily as needed for heartburn.    Yes Historical Provider, MD     (Not in a hospital admission)  Results for orders placed or performed during the hospital encounter of 08/19/14 (from the past 48 hour(s))  CBC with Differential      Status: None   Collection Time: 08/19/14  7:28 AM  Result Value Ref Range   WBC 7.8 4.0 - 10.5 K/uL   RBC 4.69 4.22 - 5.81 MIL/uL   Hemoglobin 13.7 13.0 - 17.0 g/dL   HCT 39.2 39.0 - 52.0 %   MCV 83.6 78.0 - 100.0 fL   MCH 29.2 26.0 - 34.0 pg   MCHC 34.9 30.0 - 36.0 g/dL   RDW 12.3 11.5 - 15.5 %   Platelets 192 150 - 400 K/uL   Neutrophils Relative % 67 43 - 77 %   Neutro Abs 5.2 1.7 - 7.7 K/uL   Lymphocytes Relative 24 12 - 46 %   Lymphs Abs 1.9 0.7 - 4.0 K/uL   Monocytes Relative 8 3 - 12 %   Monocytes Absolute 0.7 0.1 - 1.0 K/uL   Eosinophils Relative 1 0 - 5 %   Eosinophils Absolute 0.1 0.0 - 0.7 K/uL   Basophils Relative 0 0 - 1 %   Basophils Absolute 0.0 0.0 - 0.1 K/uL  Comprehensive metabolic panel     Status: Abnormal   Collection Time: 08/19/14  7:28 AM  Result Value Ref Range   Sodium 138 135 - 145 mmol/L   Potassium 3.9 3.5 - 5.1 mmol/L   Chloride 104 96 - 112 mmol/L   CO2 24 19 - 32 mmol/L   Glucose, Bld 109 (H) 70 - 99 mg/dL   BUN 20 6 -  23 mg/dL   Creatinine, Ser 0.95 0.50 - 1.35 mg/dL   Calcium 9.3 8.4 - 10.5 mg/dL   Total Protein 6.8 6.0 - 8.3 g/dL   Albumin 4.2 3.5 - 5.2 g/dL   AST 20 0 - 37 U/L   ALT 25 0 - 53 U/L   Alkaline Phosphatase 60 39 - 117 U/L   Total Bilirubin 0.8 0.3 - 1.2 mg/dL   GFR calc non Af Amer >90 >90 mL/min   GFR calc Af Amer >90 >90 mL/min    Comment: (NOTE) The eGFR has been calculated using the CKD EPI equation. This calculation has not been validated in all clinical situations. eGFR's persistently <90 mL/min signify possible Chronic Kidney Disease.    Anion gap 10 5 - 15   Ct Abdomen Pelvis W Contrast  08/19/2014   CLINICAL DATA:  Right lower quadrant abdominal pain with vomiting beginning yesterday. Low back pain. Gastroesophageal reflux.  EXAM: CT ABDOMEN AND PELVIS WITH CONTRAST  TECHNIQUE: Multidetector CT imaging of the abdomen and pelvis was performed using the standard protocol following bolus administration of  intravenous contrast.  CONTRAST:  4m OMNIPAQUE IOHEXOL 300 MG/ML  SOLN  COMPARISON:  None.  FINDINGS: Minimal dependent atelectasis is present in the lung bases. No pleural effusion.  The liver, gallbladder, spleen, adrenal glands, kidneys, and pancreas have an unremarkable enhanced appearance.  Oral contrast is present in nondilated loops of distal small bowel and large bowel to the level of the ascending colon. There is no evidence of obstruction. Appendix is mildly enlarged near its base, measuring 10 mm in diameter, with evidence of mild wall thickening, hyperenhancement, and very mild periappendiceal inflammatory changes. Appendix is retrocecal in location. There is no evidence of perforation or fluid collection. There are an increased number of small right lower quadrant lymph nodes measuring up to 5 mm in short axis.  Bladder is unremarkable. No intraperitoneal free fluid is seen. No acute osseous abnormality is identified.  IMPRESSION: Acute appendicitis without evidence of perforation.  These results were called by telephone at the time of interpretation on 08/19/2014 at 10:11 am [facility] , who verbally acknowledged these results.   Electronically Signed   By: ALogan Bores  On: 08/19/2014 10:12    Review of Systems  Constitutional: Negative for fever, chills, weight loss and malaise/fatigue.  Eyes: Negative for blurred vision and double vision.  Respiratory: Negative for cough, hemoptysis, sputum production, shortness of breath and wheezing.   Cardiovascular: Negative for chest pain, palpitations, orthopnea, claudication, leg swelling and PND.  Gastrointestinal: Positive for heartburn, vomiting and abdominal pain. Negative for nausea, diarrhea, constipation, blood in stool and melena.  Genitourinary: Negative for dysuria, urgency, frequency, hematuria and flank pain.  Musculoskeletal: Positive for back pain. Negative for myalgias and neck pain.  Neurological: Negative.  Negative for  weakness and headaches.  Endo/Heme/Allergies: Negative.   Psychiatric/Behavioral: Negative.     Blood pressure 117/71, pulse 59, temperature 97.9 F (36.6 C), temperature source Oral, resp. rate 18, height 6' 3.5" (1.918 m), weight 111.131 kg (245 lb), SpO2 100 %. Physical Exam  Constitutional: He is oriented to person, place, and time. He appears well-developed and well-nourished. No distress.  HENT:  Head: Normocephalic and atraumatic.  Eyes: Right eye exhibits no discharge. Left eye exhibits no discharge. No scleral icterus.  Cardiovascular: Normal rate, regular rhythm, normal heart sounds and intact distal pulses.  Exam reveals no gallop and no friction rub.   No murmur heard. Respiratory:  Effort normal and breath sounds normal. No respiratory distress. He has no wheezes. He has no rales. He exhibits no tenderness.  GI: Soft. Bowel sounds are normal. He exhibits no distension and no mass. There is no rebound and no guarding.  RLQ tenderness   Musculoskeletal: Normal range of motion. He exhibits no edema or tenderness.  Neurological: He is alert and oriented to person, place, and time.  Skin: Skin is warm and dry. No rash noted. He is not diaphoretic. No erythema. No pallor.  Psychiatric: He has a normal mood and affect. His behavior is normal. Judgment and thought content normal.     Assessment/Plan Acute appendicitis -admit -to OR later today for appendectomy.  Surgical risks discussed including infection, bleeding, injury to surrounding structures, anesthesia risks.  The patient verbalizes understanding and wishes to proceed.  -obtain consent -rocephin/flagyl -pain control/antiemetics VTE prophylaxis-SCD, post op lovenox FEN-NPO, IVF HTN-home med GERD-protonix   Tamala Manzer ANP-BC 08/19/2014, 10:55 AM

## 2014-08-19 NOTE — ED Notes (Signed)
CT aware that patient has completed his oral contrast.

## 2014-08-19 NOTE — Transfer of Care (Signed)
Immediate Anesthesia Transfer of Care Note  Patient: Edward Stanley  Procedure(s) Performed: Procedure(s): APPENDECTOMY LAPAROSCOPIC (N/A)  Patient Location: PACU  Anesthesia Type:General  Level of Consciousness: awake, alert , oriented and patient cooperative  Airway & Oxygen Therapy: Patient Spontanous Breathing and Patient connected to nasal cannula oxygen  Post-op Assessment: Report given to RN, Post -op Vital signs reviewed and stable and Patient moving all extremities  Post vital signs: Reviewed and stable  Last Vitals:  Filed Vitals:   08/19/14 1245  BP: 106/57  Pulse: 53  Temp:   Resp:     Complications: No apparent anesthesia complications

## 2014-08-19 NOTE — ED Notes (Signed)
Pt c/o right lower quadrant abdominal pain with vomiting onset yesterday around lunch time. Pt also reports some low back pain. Pt has history of acid reflux x 1 year.

## 2014-08-19 NOTE — Discharge Instructions (Signed)

## 2014-08-22 ENCOUNTER — Encounter (HOSPITAL_COMMUNITY): Payer: Self-pay | Admitting: Surgery

## 2014-08-25 NOTE — Discharge Summary (Signed)
  Physician Discharge Summary  Patient ID: Edward CumminsBradley Raker MRN: [id number] DOB/AGE: 36/08/1980 36 y.o.  Admit date: 08/19/2014 Discharge date: 08/19/2014  Admitting Diagnosis: Acute appendicitis   Discharge Diagnosis Patient Active Problem List   Diagnosis Date Noted  . Acute appendicitis without peritonitis 08/19/2014    Consultants non4  Imaging: No results found.  Procedures Laparoscopic appendectomy---Dr. Orpah Clintonsuei   Hospital Course:  Edward CumminsBradley Sanor is a 34 year old male with a history of HTN and GERD presented with RLQ abdominal pain. His work up shows a normal white count. CT of abdomen and pelvis revealed acute appendicitis.  Patient was admitted and underwent procedure listed above.  Tolerated procedure well.  Diet was advanced as tolerated.  Remained stable and was discharged home.  Instructions and follow up appointment were provided.        Medication List    TAKE these medications        DEXILANT 60 MG capsule  Generic drug:  dexlansoprazole  Take 60 mg by mouth daily.     HYDROcodone-acetaminophen 5-325 MG per tablet  Commonly known as:  NORCO/VICODIN  Take 1-2 tablets by mouth every 6 (six) hours as needed for moderate pain.     ranitidine 150 MG capsule  Commonly known as:  ZANTAC  Take 150 mg by mouth daily as needed for heartburn.     ZESTORETIC 20-12.5 MG per tablet  Generic drug:  lisinopril-hydrochlorothiazide  Take 1 tablet by mouth.             Follow-up Information    Follow up with [facility] [place] On 09/06/2014.   Why:  For post-operation check. Your appointment is at 2:45pm, please arrive at least 30 min before your appointment to complete your check in paperwork.  If you are unable to arrive 30 min prior to your appointment time we may have to cancel or reschedule you   Contact information:   [address] [place] [id number] [phone]      Signed: Ashok Norrismina Sheldon Amara, [facility] [phone]  08/25/2014, 12:53 PM

## 2016-06-25 ENCOUNTER — Ambulatory Visit (INDEPENDENT_AMBULATORY_CARE_PROVIDER_SITE_OTHER): Payer: Self-pay | Admitting: Orthopaedic Surgery

## 2016-07-01 ENCOUNTER — Encounter (INDEPENDENT_AMBULATORY_CARE_PROVIDER_SITE_OTHER): Payer: Self-pay | Admitting: Orthopaedic Surgery

## 2016-07-01 ENCOUNTER — Ambulatory Visit (INDEPENDENT_AMBULATORY_CARE_PROVIDER_SITE_OTHER): Payer: Self-pay

## 2016-07-01 ENCOUNTER — Ambulatory Visit (INDEPENDENT_AMBULATORY_CARE_PROVIDER_SITE_OTHER): Payer: PRIVATE HEALTH INSURANCE | Admitting: Orthopaedic Surgery

## 2016-07-01 VITALS — BP 117/84 | HR 98 | Ht 76.0 in | Wt 225.0 lb

## 2016-07-01 DIAGNOSIS — M25551 Pain in right hip: Secondary | ICD-10-CM

## 2016-07-01 DIAGNOSIS — M5126 Other intervertebral disc displacement, lumbar region: Secondary | ICD-10-CM | POA: Diagnosis not present

## 2016-07-01 DIAGNOSIS — M5441 Lumbago with sciatica, right side: Secondary | ICD-10-CM | POA: Diagnosis not present

## 2016-07-01 MED ORDER — HYDROCODONE-ACETAMINOPHEN 7.5-325 MG PO TABS: 1.0000 | ORAL_TABLET | Freq: Four times a day (QID) | ORAL | 0 refills | Status: DC | PRN

## 2016-07-01 NOTE — Progress Notes (Signed)
   Office Visit Note   Patient: Edward Stanley           Date of Birth: 12/10/1980           MRN: [id number] Visit Date: 07/01/2016              Requested by: Eartha InchMichael C Badger, MD [address] [place], [place] [address] PCP: Eartha InchBADGER,MICHAEL C, MD   Assessment & Plan: Visit Diagnoses: Acute onset of low back pain associated with right lower extremity radiculopathy I suspect nerve root irritation on the right at either L4-5 or L5-S1  Plan: MRI scan lumbar spine, hydrocortisone for pain, continue Zanaflex  Follow-Up Instructions: No Follow-up on file.   Orders:  No orders of the defined types were placed in this encounter.  No orders of the defined types were placed in this encounter.     Procedures: No procedures performed   Clinical Data: No additional findings.   Subjective: No chief complaint on file.   Mr. Edward AxeComer is a 36 year old male that presents with LBP and Right hip pain that radiates down to hamstring. His PCP put him on methylprednisone dose pack and his low back pain went away.on his back. 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 His only position of comfort is lying flat on his back. He noticed is having to urinate more frequently since the pain started. His job requires sitting 4 hours a day in the car.  Has had increased urinary frequency since he started taking the prednisone. No pain on the left side. Occasionally some numbness in his right foot. Back pain much better since he started the prednisone but still having right buttock and right thigh pain.  Review of Systems   Objective: Vital Signs: There were no vitals taken for this visit.  Physical Exam  Ortho Exam straight leg raise positive on the left side for right buttock and proximal thigh pain. Straight leg raise positive on the right for right buttock and right posterior thigh pain. Knees reflexes symmetrical. Slight decreased right ankle  reflex compared to the left intact. Sensory exam intact. No percussible tenderness lumbar spine. No pain with range of motion of either hip.  Specialty Comments:  No specialty comments available.  Imaging: No results found.   PMFS History: Patient Active Problem List   Diagnosis Date Noted  . Acute appendicitis without peritonitis 08/19/2014   Past Medical History:  Diagnosis Date  . GERD (gastroesophageal reflux disease)   . HTN (hypertension)     Family History  Problem Relation Age of Onset  . Heart disease Father   . Heart attack Paternal Grandfather   . Breast cancer Paternal Grandmother   . Prostate cancer Paternal Grandfather     Past Surgical History:  Procedure Laterality Date  . LAPAROSCOPIC APPENDECTOMY N/A 08/19/2014   Procedure: APPENDECTOMY LAPAROSCOPIC;  Surgeon: Manus RuddMatthew Tsuei, MD;  Location: [facility] OR;  Service: General;  Laterality: N/A;  . None     Social History   Occupational History  . Sales    Social History Main Topics  . Smoking status: Never Smoker  . Smokeless tobacco: Not on file  . Alcohol use 4.2 oz/week    7 Cans of beer per week     Comment: 1 drink per night   . Drug use: No  . Sexual activity: Not on file

## 2016-07-04 ENCOUNTER — Telehealth (INDEPENDENT_AMBULATORY_CARE_PROVIDER_SITE_OTHER): Payer: Self-pay | Admitting: *Deleted

## 2016-07-04 NOTE — Telephone Encounter (Signed)
Pt called stating he contacted his insurance company and was told was missing paperwork, I called and confirmed with his insurance and was told that there was no missing paper work and still in revies and should have  status around 5pm today.

## 2016-07-05 ENCOUNTER — Telehealth (INDEPENDENT_AMBULATORY_CARE_PROVIDER_SITE_OTHER): Payer: Self-pay | Admitting: Orthopaedic Surgery

## 2016-07-05 ENCOUNTER — Ambulatory Visit (HOSPITAL_COMMUNITY)
Admission: RE | Admit: 2016-07-05 | Discharge: 2016-07-05 | Disposition: A | Discharge status: Home / Self Care | Payer: PRIVATE HEALTH INSURANCE | Source: Ambulatory Visit | Attending: Orthopaedic Surgery | Admitting: Orthopaedic Surgery

## 2016-07-05 DIAGNOSIS — M48061 Spinal stenosis, lumbar region without neurogenic claudication: Secondary | ICD-10-CM | POA: Diagnosis not present

## 2016-07-05 DIAGNOSIS — M5136 Other intervertebral disc degeneration, lumbar region: Secondary | ICD-10-CM | POA: Insufficient documentation

## 2016-07-05 DIAGNOSIS — M5126 Other intervertebral disc displacement, lumbar region: Secondary | ICD-10-CM | POA: Insufficient documentation

## 2016-07-05 NOTE — Telephone Encounter (Signed)
Patient has his MRI scheduled for this afternoon.  He was also calling about a shot for pain.  Did not know if he would be able to get one today or this weekend

## 2016-07-05 NOTE — Telephone Encounter (Signed)
Left message for Mr Turpin and explained it is his insurance that is holding up his MRI. Spoke with Martie LeeSabrina, scheduler and Nida BoatmanBrad has called her every day about this.  His insurance is requiring a doctor review and DR. Whitfield offered to do a peer-to-peer to expedite the process today.

## 2016-07-05 NOTE — Telephone Encounter (Signed)
Keep me apprised of progress with  insurance company

## 2016-07-05 NOTE — Telephone Encounter (Signed)
Spoke with patient to let him know Dr. Alvester MorinNewton does not work Fri sat or sun for injections. Also, everything has to be approved through insurance and Dr. Alvester MorinNewton does his own scheduling. Asked about pain meds, hydrocodone, relates it does nothing for his pain.

## 2016-07-05 NOTE — Telephone Encounter (Signed)
Please read

## 2016-07-05 NOTE — Telephone Encounter (Signed)
I contacted pt insurance and spoke with Janora NorlanderLeann G nurse reviewer and we went over the pt's information and stated she sees that is was sent as urgent and will send back all the information back to the Dr. Who is reviewing it and will hear something with in 2hrs. I explained to her that Dr. Cleophas DunkerWHITFIELD is requesting a peer to peer review and she stated can not do a peer peer until the review process is completed and has been denied. I asked her if she could let the reviewer know that this is urgent and pt needs to have done within 24hrs. She voiced her understanding and will let provider know.  10:57am: Pt just called me and asked if I had heard anything and I advised him that I was just on the phone with his insurance co and told him every thing from above and he stated that [facility] called him to set him up for his MRI and they had scheduled him for next week and pt can not wait that long and he stated that he contacted [facility] and could see him tomorrow for MRI, but can not schedule until his insurance approves to have done. Pt stated he will contact his insurance and get on them to see if can get it expedited. Pt was not happy with the outcome I had given him and I explained to him that I had sent his referral x2 to [facility] as [facility].

## 2016-07-05 NOTE — Telephone Encounter (Signed)
Patient returned Janet's call. Please call patient back.

## 2016-07-05 NOTE — Telephone Encounter (Signed)
[facility] called this morning [facility] to do his MRI.  He wants to know if he has any other option or should he go to the hospital and get it done.  He states he has been laying on his back for a week and wants to get this done ASAP.  CB#[phone].  Thank you.

## 2016-07-08 ENCOUNTER — Telehealth (INDEPENDENT_AMBULATORY_CARE_PROVIDER_SITE_OTHER): Payer: Self-pay

## 2016-07-08 ENCOUNTER — Other Ambulatory Visit (INDEPENDENT_AMBULATORY_CARE_PROVIDER_SITE_OTHER): Payer: Self-pay | Admitting: *Deleted

## 2016-07-08 ENCOUNTER — Other Ambulatory Visit (INDEPENDENT_AMBULATORY_CARE_PROVIDER_SITE_OTHER): Payer: Self-pay | Admitting: Orthopaedic Surgery

## 2016-07-08 ENCOUNTER — Telehealth (INDEPENDENT_AMBULATORY_CARE_PROVIDER_SITE_OTHER): Payer: Self-pay | Admitting: Physical Medicine and Rehabilitation

## 2016-07-08 DIAGNOSIS — M5441 Lumbago with sciatica, right side: Secondary | ICD-10-CM

## 2016-07-08 DIAGNOSIS — G8929 Other chronic pain: Secondary | ICD-10-CM

## 2016-07-08 DIAGNOSIS — M5442 Lumbago with sciatica, left side: Principal | ICD-10-CM

## 2016-07-08 NOTE — Telephone Encounter (Signed)
Patient called back wanting to know the first available appointment- he is wanting something this week, but we don't have any openings right now. He is going to call Dr. Hoy RegisterWhitfield's office to see if they [facility].

## 2016-07-08 NOTE — Telephone Encounter (Signed)
Called [facility] to try and get Mr. Saal into an appt. for a depomedrol lumbar injection ASAP. Dr. Alvester MorinNewton could not see him until 3/28 and PW wanted to get Mr. Meadors in somewhere more quickly due to his MRI results.   Spoke with P[place]ta and she said they had an opening she would call him.

## 2016-07-08 NOTE — Telephone Encounter (Signed)
According to other message pt is being referred [facility] for injection

## 2016-07-09 ENCOUNTER — Ambulatory Visit
Admission: RE | Admit: 2016-07-09 | Discharge: 2016-07-09 | Disposition: A | Discharge status: Home / Self Care | Payer: PRIVATE HEALTH INSURANCE | Source: Ambulatory Visit | Attending: Orthopaedic Surgery | Admitting: Orthopaedic Surgery

## 2016-07-09 DIAGNOSIS — M5441 Lumbago with sciatica, right side: Principal | ICD-10-CM

## 2016-07-09 DIAGNOSIS — G8929 Other chronic pain: Secondary | ICD-10-CM

## 2016-07-09 DIAGNOSIS — M5442 Lumbago with sciatica, left side: Principal | ICD-10-CM

## 2016-07-09 MED ORDER — IOPAMIDOL (ISOVUE-M 200) INJECTION 41%
1.0000 mL | Freq: Once | INTRAMUSCULAR | Status: AC
  Administered 2016-07-09: 1 mL via EPIDURAL

## 2016-07-09 MED ORDER — METHYLPREDNISOLONE ACETATE 40 MG/ML INJ SUSP (RADIOLOG
120.0000 mg | Freq: Once | INTRAMUSCULAR | Status: AC
  Administered 2016-07-09: 120 mg via EPIDURAL

## 2016-07-09 NOTE — Discharge Instructions (Signed)

## 2016-07-10 ENCOUNTER — Telehealth (INDEPENDENT_AMBULATORY_CARE_PROVIDER_SITE_OTHER): Payer: Self-pay

## 2016-07-10 NOTE — Telephone Encounter (Signed)
LVMOM for Edward Stanley checking to make sure he was feeling better and to let him know Pw said he could cancel his Friday appt if feeling better.

## 2016-07-11 ENCOUNTER — Other Ambulatory Visit: Payer: PRIVATE HEALTH INSURANCE

## 2016-07-12 ENCOUNTER — Encounter (INDEPENDENT_AMBULATORY_CARE_PROVIDER_SITE_OTHER): Payer: Self-pay | Admitting: Orthopaedic Surgery

## 2016-07-12 ENCOUNTER — Ambulatory Visit (INDEPENDENT_AMBULATORY_CARE_PROVIDER_SITE_OTHER): Payer: PRIVATE HEALTH INSURANCE | Admitting: Orthopaedic Surgery

## 2016-07-12 VITALS — BP 126/93 | HR 89 | Resp 14 | Ht 76.0 in | Wt 225.0 lb

## 2016-07-12 DIAGNOSIS — G8929 Other chronic pain: Secondary | ICD-10-CM | POA: Diagnosis not present

## 2016-07-12 DIAGNOSIS — M545 Low back pain: Secondary | ICD-10-CM

## 2016-07-12 NOTE — Progress Notes (Signed)
Office Visit Note   Patient: Edward Stanley           Date of Birth: 09/15/1980           MRN: [id number] Visit Date: 07/12/2016              Requested by: Eartha InchMichael C Badger, MD [address] [place], [place] [address] PCP: Eartha InchBADGER,MICHAEL C, MD   Assessment & Plan: Visit Diagnoses:HNP L5-S1 status post epidural steroid injection with partial relief of his pain   Plan: Long discussion with Edward BoatmanBrad and his wife regarding MRI findings and what he can expect over time. After he left the office he discussed the situation with his wife and he would like to consider surgery. We will make an appointment for him to be seen sometime next week with one of my associates. Presently there is no evidence of he has any neurologic deficit.   Orders:  No orders of the defined types were placed in this encounter.  No orders of the defined types were placed in this encounter.     Procedures: No procedures performed   Clinical Data: No additional findings.   Subjective: Chief Complaint  Patient presents with  . Lower Back - Injections, Follow-up    Mr. Edward Stanley is a 36 year old male that had an MRI scan notes a bulging disc. He received his injection from [facility] 3 days ago. He states his right leg is numb and also the tips of the fingers are numb.  That is actually better since he had the injection with less pain and numbness in his right leg he still having some discomfort depending upon how long these on his feet. He denies any bowel or bladder dysfunction. He is not experiencing any trouble on the left side. He's had a prior MRI scan that demonstrated a large herniated disc at L5-S1  Review of Systems   Objective: Vital Signs: Ht 6\' 4"  (1.93 m)   Wt 225 lb (102.1 kg)   BMI 27.39 kg/m   Physical Exam  Ortho Exam straight leg raise is negative on the left. Minimally positive on the right at 90. Reflexes are symmetrical motor and sensory exam intact. No percussible tenderness of the  lumbar spine. Painless range of motion of both hips and both knees.  Specialty Comments:  No specialty comments available.  Imaging: No results found.   PMFS History: Patient Active Problem List   Diagnosis Date Noted  . Acute appendicitis without peritonitis 08/19/2014   Past Medical History:  Diagnosis Date  . GERD (gastroesophageal reflux disease)   . HTN (hypertension)     Family History  Problem Relation Age of Onset  . Heart disease Father   . Heart attack Paternal Grandfather   . Breast cancer Paternal Grandmother   . Prostate cancer Paternal Grandfather     Past Surgical History:  Procedure Laterality Date  . LAPAROSCOPIC APPENDECTOMY N/A 08/19/2014   Procedure: APPENDECTOMY LAPAROSCOPIC;  Surgeon: Manus RuddMatthew Tsuei, MD;  Location: [facility] OR;  Service: General;  Laterality: N/A;  . None     Social History   Occupational History  . Sales    Social History Main Topics  . Smoking status: Never Smoker  . Smokeless tobacco: Never Used  . Alcohol use 4.2 oz/week    7 Cans of beer per week     Comment: 1 drink per night   . Drug use: No  . Sexual activity: Not on file

## 2016-07-15 ENCOUNTER — Encounter (INDEPENDENT_AMBULATORY_CARE_PROVIDER_SITE_OTHER): Payer: Self-pay | Admitting: Specialist

## 2016-07-15 ENCOUNTER — Ambulatory Visit (INDEPENDENT_AMBULATORY_CARE_PROVIDER_SITE_OTHER): Payer: PRIVATE HEALTH INSURANCE | Admitting: Specialist

## 2016-07-15 ENCOUNTER — Encounter (HOSPITAL_COMMUNITY): Payer: Self-pay | Admitting: *Deleted

## 2016-07-15 ENCOUNTER — Ambulatory Visit (INDEPENDENT_AMBULATORY_CARE_PROVIDER_SITE_OTHER): Payer: PRIVATE HEALTH INSURANCE

## 2016-07-15 VITALS — BP 129/85 | HR 82 | Ht 76.0 in | Wt 225.0 lb

## 2016-07-15 DIAGNOSIS — M5126 Other intervertebral disc displacement, lumbar region: Secondary | ICD-10-CM

## 2016-07-15 DIAGNOSIS — M545 Low back pain: Secondary | ICD-10-CM | POA: Diagnosis not present

## 2016-07-15 DIAGNOSIS — G834 Cauda equina syndrome: Secondary | ICD-10-CM

## 2016-07-15 DIAGNOSIS — M5116 Intervertebral disc disorders with radiculopathy, lumbar region: Secondary | ICD-10-CM

## 2016-07-15 NOTE — Progress Notes (Signed)
Pt denies SOB, chest pain, and being under the care of a cardiologist. Pt denies having a cardiac cath. Pt denies having an EKG and CXR within the last year. Pt denies recent labs.  Pt made aware to stop taking Aspirin, vitamins, fish oil and herbal medications. Do not take any NSAIDs ie: Ibuprofen, Advil, Naproxen, BC and Goody Powder or any medication containing Aspirin. Pt verbalized understanding of all pre-op instructions.

## 2016-07-15 NOTE — Patient Instructions (Addendum)
Avoid frequent bending and stooping  No lifting greater than 10 lbs. May use ice or moist heat for pain. Weight loss is of benefit. Avoid bending, stooping and avoid lifting weights greater than 10 lbs. Avoid prolong standing and walking. Order for a new walker with wheels. Surgery scheduling secretary Tivis RingerSherri Billings, will call you in the next week to schedule for surgery.  Surgery recommended is a bilateral microdiscectomy L5-S1. Take hydrocodone for for pain. Risk of surgery includes risk of infection 1 in 200 patients, bleeding 1/2% chance you would need a transfusion.   Risk to the nerves is one in 10,000.

## 2016-07-15 NOTE — Progress Notes (Signed)
Office Visit Note   Patient: Edward Stanley           Date of Birth: 09/02/1980           MRN: [id number] Visit Date: 07/15/2016              Requested by: Eartha InchMichael C Badger, MD [address] [place], [place] [address] PCP: Eartha InchBADGER,MICHAEL C, MD   Assessment & Plan: Visit Diagnoses:  1. Acute right-sided low back pain, with sciatica presence unspecified   2. 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 Cauda equina syndrome (HCC)     Plan:Avoid frequent bending and stooping  No lifting greater than 10 lbs. May use ice or moist heat for pain. Weight loss is of benefit. Avoid bending, stooping and avoid lifting weights greater than 10 lbs. Avoid prolong standing and walking. Order for a new walker with wheels. Surgery scheduling secretary Tivis RingerSherri Billings, will call you in the next week to schedule for surgery.  Surgery recommended is a bilateral microdiscectomy L5-S1. Take hydrocodone for for pain. Risk of surgery includes risk of infection 1 in 200 patients, bleeding 1/2% chance you would need a transfusion.   Risk to the nerves is one in 10,000.       Follow-Up Instructions: Return in about 3 weeks (around 08/05/2016).   Orders:  Orders Placed This Encounter  Procedures  . XR Lumb Spine Flex&Ext Only   No orders of the defined types were placed in this encounter.     Procedures: No procedures performed   Clinical Data: No additional findings.   Subjective: Chief Complaint  Patient presents with  . Lower Back - Pain    Edward Stanley was referred to Dr. Otelia SergeantNitka by Dr. Francis DowseWhifield for low back pain.  He states that it started about 3 weeks ago with low back pain only, he saw his PCP, who prescribed prednisone and muscle relaxer. Then the pain went to his right buttock and down to his right thigh.  [facility] Right L5-S1 IL, he states that it has made some difference, that he was able to  make it about 10 minutes to our office today for this appointment. He does state that the pain worsens throughout the day with increased activity.  He states that he has not worked in 2 weeks due to this pain. Began having epsodic pain about 5-6 years ago after a golf practice with his son. More recently had a flare back and right leg pain began about 2 weeks ago, he rides vehicles daily sales of irrigation. Lying down the pain improves.  Sitting or standing with pain.Riding in vehicles with pain and having to shift to decrease pain. Numbness in the right buttock and right post thigh, some numbness in the tips of the toes right. Bowel and bladder okay, no incontinence. Some increase frequency. Had an ESI at [facility] last Tuesday one week age without help.     Review of Systems  Constitutional: Negative.   HENT: Negative.   Eyes: Negative.   Respiratory: Negative.   Cardiovascular: Negative.   Gastrointestinal: Negative.   Endocrine: Negative.   Genitourinary: Negative.   Musculoskeletal: Negative.   Skin: Negative.   Allergic/Immunologic: Negative.   Neurological: Negative.   Hematological: Negative.   Psychiatric/Behavioral: Negative.      Objective: Vital Signs: BP 129/85 (BP Location: Left Arm, Patient Position: Sitting)   Pulse 82   Ht  6\' 4"  (1.93 m)   Wt 225 lb (102.1 kg)   BMI 27.39 kg/m   Physical Exam  Constitutional: He is oriented to person, place, and time. He appears well-developed and well-nourished.  HENT:  Head: Normocephalic and atraumatic.  Eyes: EOM are normal. Pupils are equal, round, and reactive to light.  Neck: Normal range of motion. Neck supple.  Pulmonary/Chest: Effort normal and breath sounds normal.  Abdominal: Soft. Bowel sounds are normal.  Neurological: He is alert and oriented to person, place, and time.  Skin: Skin is warm and dry.  Psychiatric: He has a normal mood and affect. His behavior is normal. Judgment and thought content normal.     Back Exam   Tenderness  The patient is experiencing tenderness in the lumbar.  Range of Motion  Lateral Bend Right: abnormal  Lateral Bend Left: abnormal  Rotation Right: abnormal  Rotation Left: abnormal   Muscle Strength  Right Quadriceps:  5/5  Left Quadriceps:  5/5  Right Hamstrings:  5/5  Left Hamstrings:  5/5   Tests  Straight leg raise right: positive Straight leg raise left: positive  Reflexes  Patellar: 2/4 Achilles: 1/4 Babinski's sign: normal   Other  Toe Walk: normal Heel Walk: normal Sensation: normal Gait: normal  Erythema: no back redness Scars: absent      Specialty Comments:  No specialty comments available.  Imaging: No results found.   PMFS History: Patient Active Problem List   Diagnosis Date Noted  . Acute appendicitis without peritonitis 08/19/2014   Past Medical History:  Diagnosis Date  . GERD (gastroesophageal reflux disease)   . HTN (hypertension)     Family History  Problem Relation Age of Onset  . Heart disease Father   . Heart attack Paternal Grandfather   . Breast cancer Paternal Grandmother   . Prostate cancer Paternal Grandfather     Past Surgical History:  Procedure Laterality Date  . LAPAROSCOPIC APPENDECTOMY N/A 08/19/2014   Procedure: APPENDECTOMY LAPAROSCOPIC;  Surgeon: Manus Rudd, MD;  Location: [facility] OR;  Service: General;  Laterality: N/A;  . None     Social History   Occupational History  . Sales    Social History Main Topics  . Smoking status: Never Smoker  . Smokeless tobacco: Never Used  . Alcohol use 4.2 oz/week    7 Cans of beer per week     Comment: 1 drink per night   . Drug use: No  . Sexual activity: Not on file

## 2016-07-16 ENCOUNTER — Ambulatory Visit (HOSPITAL_COMMUNITY): Payer: PRIVATE HEALTH INSURANCE | Admitting: Certified Registered Nurse Anesthetist

## 2016-07-16 ENCOUNTER — Ambulatory Visit (HOSPITAL_COMMUNITY): Payer: PRIVATE HEALTH INSURANCE

## 2016-07-16 ENCOUNTER — Encounter (HOSPITAL_COMMUNITY): Payer: Self-pay | Admitting: *Deleted

## 2016-07-16 ENCOUNTER — Observation Stay (HOSPITAL_COMMUNITY)
Admission: RE | Admit: 2016-07-16 | Discharge: 2016-07-17 | Disposition: A | Discharge status: Home Health | Payer: PRIVATE HEALTH INSURANCE | Source: Ambulatory Visit | Attending: Specialist | Admitting: Specialist

## 2016-07-16 ENCOUNTER — Encounter (HOSPITAL_COMMUNITY)
Admission: RE | Disposition: A | Discharge status: Home Health | Payer: Self-pay | Source: Ambulatory Visit | Attending: Specialist

## 2016-07-16 DIAGNOSIS — K219 Gastro-esophageal reflux disease without esophagitis: Secondary | ICD-10-CM | POA: Diagnosis not present

## 2016-07-16 DIAGNOSIS — Z419 Encounter for procedure for purposes other than remedying health state, unspecified: Secondary | ICD-10-CM

## 2016-07-16 DIAGNOSIS — Z79899 Other long term (current) drug therapy: Secondary | ICD-10-CM | POA: Insufficient documentation

## 2016-07-16 DIAGNOSIS — M5117 Intervertebral disc disorders with radiculopathy, lumbosacral region: Principal | ICD-10-CM | POA: Insufficient documentation

## 2016-07-16 DIAGNOSIS — M5116 Intervertebral disc disorders with radiculopathy, lumbar region: Secondary | ICD-10-CM | POA: Diagnosis present

## 2016-07-16 DIAGNOSIS — M5126 Other intervertebral disc displacement, lumbar region: Secondary | ICD-10-CM | POA: Diagnosis present

## 2016-07-16 DIAGNOSIS — I10 Essential (primary) hypertension: Secondary | ICD-10-CM | POA: Insufficient documentation

## 2016-07-16 HISTORY — PX: LUMBAR LAMINECTOMY: SHX95

## 2016-07-16 HISTORY — DX: Reserved for concepts with insufficient information to code with codable children: IMO0002

## 2016-07-16 LAB — BASIC METABOLIC PANEL
Anion gap: 10 (ref 5–15)
BUN: 17 mg/dL (ref 6–20)
CHLORIDE: 103 mmol/L (ref 101–111)
CO2: 26 mmol/L (ref 22–32)
Calcium: 9.6 mg/dL (ref 8.9–10.3)
Creatinine, Ser: 0.79 mg/dL (ref 0.61–1.24)
GFR calc Af Amer: >60 mL/min (ref >60)
GLUCOSE: 99 mg/dL (ref 65–99)
POTASSIUM: 4.3 mmol/L (ref 3.5–5.1)
Sodium: 139 mmol/L (ref 135–145)

## 2016-07-16 LAB — CBC
HEMATOCRIT: 40.2 % (ref 39.0–52.0)
HEMOGLOBIN: 13.9 g/dL (ref 13.0–17.0)
MCH: 29.6 pg (ref 26.0–34.0)
MCHC: 34.6 g/dL (ref 30.0–36.0)
MCV: 85.5 fL (ref 78.0–100.0)
Platelets: 191 10*3/uL (ref 150–400)
RBC: 4.7 MIL/uL (ref 4.22–5.81)
RDW: 12 % (ref 11.5–15.5)
WBC: 4.7 10*3/uL (ref 4.0–10.5)

## 2016-07-16 SURGERY — MICRODISCECTOMY LUMBAR LAMINECTOMY
Anesthesia: General

## 2016-07-16 MED ORDER — ROCURONIUM BROMIDE 100 MG/10ML IV SOLN
INTRAVENOUS | Status: DC | PRN
  Administered 2016-07-16: 50 mg via INTRAVENOUS

## 2016-07-16 MED ORDER — THROMBIN 20000 UNITS EX SOLR
CUTANEOUS | Status: AC
  Filled 2016-07-16: qty 20000

## 2016-07-16 MED ORDER — SUGAMMADEX SODIUM 200 MG/2ML IV SOLN
INTRAVENOUS | Status: DC | PRN
  Administered 2016-07-16: 200 mg via INTRAVENOUS

## 2016-07-16 MED ORDER — SODIUM CHLORIDE 0.9% FLUSH: 3.0000 mL | INTRAVENOUS | Status: DC | PRN

## 2016-07-16 MED ORDER — GABAPENTIN 300 MG PO CAPS
300.0000 mg | ORAL_CAPSULE | Freq: Three times a day (TID) | ORAL | Status: DC
  Administered 2016-07-16 – 2016-07-17 (×2): 300 mg via ORAL
  Filled 2016-07-16 (×2): qty 1

## 2016-07-16 MED ORDER — MIDAZOLAM HCL 2 MG/2ML IJ SOLN
INTRAMUSCULAR | Status: DC | PRN
  Administered 2016-07-16 (×2): 1 mg via INTRAVENOUS

## 2016-07-16 MED ORDER — LIDOCAINE HCL (CARDIAC) 20 MG/ML IV SOLN
INTRAVENOUS | Status: DC | PRN
  Administered 2016-07-16: 100 mg via INTRATRACHEAL

## 2016-07-16 MED ORDER — PROPOFOL 10 MG/ML IV BOLUS
INTRAVENOUS | Status: DC | PRN
  Administered 2016-07-16: 200 mg via INTRAVENOUS

## 2016-07-16 MED ORDER — CEFAZOLIN SODIUM-DEXTROSE 2-4 GM/100ML-% IV SOLN
2.0000 g | INTRAVENOUS | Status: AC
  Administered 2016-07-16: 2 g via INTRAVENOUS

## 2016-07-16 MED ORDER — POLYETHYLENE GLYCOL 3350 17 G PO PACK: 17.0000 g | PACK | Freq: Every day | ORAL | Status: DC | PRN

## 2016-07-16 MED ORDER — ACETAMINOPHEN 500 MG PO TABS
1000.0000 mg | ORAL_TABLET | Freq: Four times a day (QID) | ORAL | Status: DC
  Administered 2016-07-16 – 2016-07-17 (×3): 1000 mg via ORAL
  Filled 2016-07-16 (×3): qty 2

## 2016-07-16 MED ORDER — PANTOPRAZOLE SODIUM 40 MG PO TBEC
40.0000 mg | DELAYED_RELEASE_TABLET | Freq: Every day | ORAL | Status: DC
  Administered 2016-07-17: 40 mg via ORAL
  Filled 2016-07-16: qty 1

## 2016-07-16 MED ORDER — METHOCARBAMOL 500 MG PO TABS: 500.0000 mg | ORAL_TABLET | Freq: Four times a day (QID) | ORAL | 1 refills | Status: DC | PRN

## 2016-07-16 MED ORDER — LIDOCAINE 2% (20 MG/ML) 5 ML SYRINGE
INTRAMUSCULAR | Status: AC
  Filled 2016-07-16: qty 5

## 2016-07-16 MED ORDER — BUPIVACAINE LIPOSOME 1.3 % IJ SUSP
20.0000 mL | INTRAMUSCULAR | Status: AC
  Administered 2016-07-16: 20 mL
  Filled 2016-07-16: qty 20

## 2016-07-16 MED ORDER — ONDANSETRON HCL 4 MG PO TABS: 4.0000 mg | ORAL_TABLET | Freq: Four times a day (QID) | ORAL | Status: DC | PRN

## 2016-07-16 MED ORDER — OXYCODONE HCL 5 MG PO TABS
5.0000 mg | ORAL_TABLET | ORAL | Status: DC | PRN
  Administered 2016-07-16: 5 mg via ORAL

## 2016-07-16 MED ORDER — ARTIFICIAL TEARS OP OINT
TOPICAL_OINTMENT | OPHTHALMIC | Status: DC | PRN
  Administered 2016-07-16: 1 via OPHTHALMIC

## 2016-07-16 MED ORDER — DEXAMETHASONE SODIUM PHOSPHATE 10 MG/ML IJ SOLN
INTRAMUSCULAR | Status: DC | PRN
  Administered 2016-07-16: 10 mg via INTRAVENOUS

## 2016-07-16 MED ORDER — METHOCARBAMOL 500 MG PO TABS
ORAL_TABLET | ORAL | Status: AC
  Filled 2016-07-16: qty 1

## 2016-07-16 MED ORDER — SUGAMMADEX SODIUM 200 MG/2ML IV SOLN
INTRAVENOUS | Status: AC
  Filled 2016-07-16: qty 2

## 2016-07-16 MED ORDER — FLEET ENEMA 7-19 GM/118ML RE ENEM: 1.0000 | ENEMA | Freq: Once | RECTAL | Status: DC | PRN

## 2016-07-16 MED ORDER — ARTIFICIAL TEARS OP OINT
TOPICAL_OINTMENT | OPHTHALMIC | Status: AC
  Filled 2016-07-16: qty 3.5

## 2016-07-16 MED ORDER — 0.9 % SODIUM CHLORIDE (POUR BTL) OPTIME
TOPICAL | Status: DC | PRN
  Administered 2016-07-16: 1000 mL

## 2016-07-16 MED ORDER — LACTATED RINGERS IV SOLN
INTRAVENOUS | Status: DC
  Administered 2016-07-16 (×2): via INTRAVENOUS

## 2016-07-16 MED ORDER — HYDROMORPHONE HCL 1 MG/ML IJ SOLN
INTRAMUSCULAR | Status: AC
  Filled 2016-07-16: qty 0.5

## 2016-07-16 MED ORDER — ACETAMINOPHEN 650 MG RE SUPP: 650.0000 mg | RECTAL | Status: DC | PRN

## 2016-07-16 MED ORDER — OXYCODONE HCL 5 MG PO TABS
ORAL_TABLET | ORAL | Status: AC
  Filled 2016-07-16: qty 1

## 2016-07-16 MED ORDER — BUPIVACAINE HCL 0.5 % IJ SOLN
INTRAMUSCULAR | Status: DC | PRN
  Administered 2016-07-16: 30 mL

## 2016-07-16 MED ORDER — MENTHOL 3 MG MT LOZG: 1.0000 | LOZENGE | OROMUCOSAL | Status: DC | PRN

## 2016-07-16 MED ORDER — METHOCARBAMOL 1000 MG/10ML IJ SOLN
500.0000 mg | Freq: Four times a day (QID) | INTRAMUSCULAR | Status: DC | PRN
  Filled 2016-07-16: qty 5

## 2016-07-16 MED ORDER — DOCUSATE SODIUM 100 MG PO CAPS
100.0000 mg | ORAL_CAPSULE | Freq: Two times a day (BID) | ORAL | Status: DC
  Administered 2016-07-16 – 2016-07-17 (×2): 100 mg via ORAL
  Filled 2016-07-16 (×2): qty 1

## 2016-07-16 MED ORDER — PROPOFOL 10 MG/ML IV BOLUS
INTRAVENOUS | Status: AC
  Filled 2016-07-16: qty 20

## 2016-07-16 MED ORDER — ONDANSETRON HCL 4 MG/2ML IJ SOLN: 4.0000 mg | Freq: Four times a day (QID) | INTRAMUSCULAR | Status: DC | PRN

## 2016-07-16 MED ORDER — HYDROCHLOROTHIAZIDE 12.5 MG PO CAPS
12.5000 mg | ORAL_CAPSULE | Freq: Every day | ORAL | Status: DC
  Administered 2016-07-17: 12.5 mg via ORAL
  Filled 2016-07-16: qty 1

## 2016-07-16 MED ORDER — ACETAMINOPHEN 325 MG PO TABS: 650.0000 mg | ORAL_TABLET | ORAL | Status: DC | PRN

## 2016-07-16 MED ORDER — HYDROMORPHONE HCL 1 MG/ML IJ SOLN
0.2500 mg | INTRAMUSCULAR | Status: DC | PRN
  Administered 2016-07-16: 0.5 mg via INTRAVENOUS

## 2016-07-16 MED ORDER — SODIUM CHLORIDE 0.9 % IV SOLN: 250.0000 mL | INTRAVENOUS | Status: DC

## 2016-07-16 MED ORDER — MORPHINE SULFATE (PF) 4 MG/ML IV SOLN: 1.0000 mg | INTRAVENOUS | Status: DC | PRN

## 2016-07-16 MED ORDER — KETOROLAC TROMETHAMINE 15 MG/ML IJ SOLN
15.0000 mg | Freq: Four times a day (QID) | INTRAMUSCULAR | Status: DC
  Administered 2016-07-16: 15 mg via INTRAVENOUS

## 2016-07-16 MED ORDER — MIDAZOLAM HCL 2 MG/2ML IJ SOLN
INTRAMUSCULAR | Status: AC
  Filled 2016-07-16: qty 2

## 2016-07-16 MED ORDER — GLYCOPYRROLATE 0.2 MG/ML IJ SOLN
INTRAMUSCULAR | Status: DC | PRN
  Administered 2016-07-16: 0.2 mg via INTRAVENOUS

## 2016-07-16 MED ORDER — ONDANSETRON HCL 4 MG/2ML IJ SOLN
INTRAMUSCULAR | Status: DC | PRN
  Administered 2016-07-16: 4 mg via INTRAVENOUS

## 2016-07-16 MED ORDER — ALUM & MAG HYDROXIDE-SIMETH 200-200-20 MG/5ML PO SUSP: 30.0000 mL | Freq: Four times a day (QID) | ORAL | Status: DC | PRN

## 2016-07-16 MED ORDER — KETOROLAC TROMETHAMINE 15 MG/ML IJ SOLN
INTRAMUSCULAR | Status: AC
  Filled 2016-07-16: qty 1

## 2016-07-16 MED ORDER — LISINOPRIL-HYDROCHLOROTHIAZIDE 20-12.5 MG PO TABS: 2.0000 | ORAL_TABLET | Freq: Every day | ORAL | Status: DC

## 2016-07-16 MED ORDER — CEFAZOLIN SODIUM-DEXTROSE 2-4 GM/100ML-% IV SOLN
2.0000 g | Freq: Three times a day (TID) | INTRAVENOUS | Status: AC
  Administered 2016-07-16 – 2016-07-17 (×2): 2 g via INTRAVENOUS
  Filled 2016-07-16 (×2): qty 100

## 2016-07-16 MED ORDER — FENTANYL CITRATE (PF) 100 MCG/2ML IJ SOLN
INTRAMUSCULAR | Status: AC
  Filled 2016-07-16: qty 2

## 2016-07-16 MED ORDER — OXYCODONE-ACETAMINOPHEN 7.5-325 MG PO TABS: 1.0000 | ORAL_TABLET | ORAL | 0 refills | Status: DC | PRN

## 2016-07-16 MED ORDER — PROMETHAZINE HCL 25 MG/ML IJ SOLN: 6.2500 mg | INTRAMUSCULAR | Status: DC | PRN

## 2016-07-16 MED ORDER — METHOCARBAMOL 500 MG PO TABS
500.0000 mg | ORAL_TABLET | Freq: Four times a day (QID) | ORAL | Status: DC | PRN
  Administered 2016-07-16 – 2016-07-17 (×2): 500 mg via ORAL
  Filled 2016-07-16: qty 1

## 2016-07-16 MED ORDER — THROMBIN 20000 UNITS EX KIT
PACK | CUTANEOUS | Status: DC | PRN
  Administered 2016-07-16: 14:00:00 via TOPICAL

## 2016-07-16 MED ORDER — FENTANYL CITRATE (PF) 100 MCG/2ML IJ SOLN
INTRAMUSCULAR | Status: DC | PRN
  Administered 2016-07-16: 100 ug via INTRAVENOUS
  Administered 2016-07-16: 50 ug via INTRAVENOUS
  Administered 2016-07-16: 100 ug via INTRAVENOUS
  Administered 2016-07-16: 50 ug via INTRAVENOUS

## 2016-07-16 MED ORDER — BISACODYL 5 MG PO TBEC: 5.0000 mg | DELAYED_RELEASE_TABLET | Freq: Every day | ORAL | Status: DC | PRN

## 2016-07-16 MED ORDER — CHLORHEXIDINE GLUCONATE 4 % EX LIQD: 60.0000 mL | Freq: Once | CUTANEOUS | Status: DC

## 2016-07-16 MED ORDER — LISINOPRIL 20 MG PO TABS
20.0000 mg | ORAL_TABLET | Freq: Every day | ORAL | Status: DC
  Administered 2016-07-17: 20 mg via ORAL
  Filled 2016-07-16: qty 1

## 2016-07-16 MED ORDER — KETOROLAC TROMETHAMINE 15 MG/ML IJ SOLN
15.0000 mg | Freq: Four times a day (QID) | INTRAMUSCULAR | Status: DC
  Administered 2016-07-16 – 2016-07-17 (×2): 15 mg via INTRAVENOUS
  Filled 2016-07-16 (×2): qty 1

## 2016-07-16 MED ORDER — OXYCODONE HCL 5 MG PO TABS
5.0000 mg | ORAL_TABLET | ORAL | Status: DC | PRN
Start: 2016-07-16 — End: 2016-07-17
  Administered 2016-07-16 – 2016-07-17 (×3): 10 mg via ORAL
  Filled 2016-07-16 (×3): qty 2

## 2016-07-16 MED ORDER — SODIUM CHLORIDE 0.9% FLUSH
3.0000 mL | Freq: Two times a day (BID) | INTRAVENOUS | Status: DC
  Administered 2016-07-16: 3 mL via INTRAVENOUS

## 2016-07-16 MED ORDER — PHENOL 1.4 % MT LIQD: 1.0000 | OROMUCOSAL | Status: DC | PRN

## 2016-07-16 MED ORDER — ONDANSETRON HCL 4 MG/2ML IJ SOLN
INTRAMUSCULAR | Status: AC
  Filled 2016-07-16: qty 2

## 2016-07-16 MED ORDER — BUPIVACAINE HCL (PF) 0.25 % IJ SOLN
INTRAMUSCULAR | Status: AC
  Filled 2016-07-16: qty 30

## 2016-07-16 MED ORDER — ROCURONIUM BROMIDE 50 MG/5ML IV SOSY
PREFILLED_SYRINGE | INTRAVENOUS | Status: AC
  Filled 2016-07-16: qty 5

## 2016-07-16 SURGICAL SUPPLY — 53 items
BENZOIN TINCTURE PRP APPL 2/3 (GAUZE/BANDAGES/DRESSINGS) ×3 IMPLANT
BUR ROUND FLUTED 4 SOFT TCH (BURR) IMPLANT
BUR ROUND FLUTED 4MM SOFT TCH (BURR)
BUR SABER RD CUTTING 3.0 (BURR) ×4 IMPLANT
BUR SABER RD CUTTING 3.0MM (BURR) ×2
CANISTER SUCT 3000ML PPV (MISCELLANEOUS) ×3 IMPLANT
CLOSURE WOUND 1/2 X4 (GAUZE/BANDAGES/DRESSINGS) ×1
COVER MAYO STAND STRL (DRAPES) ×3 IMPLANT
COVER SURGICAL LIGHT HANDLE (MISCELLANEOUS) ×3 IMPLANT
DERMABOND ADVANCED (GAUZE/BANDAGES/DRESSINGS) ×2
DERMABOND ADVANCED .7 DNX12 (GAUZE/BANDAGES/DRESSINGS) ×1 IMPLANT
DRAPE C-ARM 42X72 X-RAY (DRAPES) IMPLANT
DRAPE MICROSCOPE LEICA (MISCELLANEOUS) ×3 IMPLANT
DRAPE PROXIMA HALF (DRAPES) IMPLANT
DRAPE SURG 17X23 STRL (DRAPES) ×12 IMPLANT
DRSG MEPILEX BORDER 4X4 (GAUZE/BANDAGES/DRESSINGS) ×3 IMPLANT
DRSG MEPILEX BORDER 4X8 (GAUZE/BANDAGES/DRESSINGS) IMPLANT
DURAPREP 26ML APPLICATOR (WOUND CARE) ×3 IMPLANT
ELECT BLADE 4.0 EZ CLEAN MEGAD (MISCELLANEOUS) ×3
ELECT CAUTERY BLADE 6.4 (BLADE) ×3 IMPLANT
ELECT REM PT RETURN 9FT ADLT (ELECTROSURGICAL) ×3
ELECTRODE BLDE 4.0 EZ CLN MEGD (MISCELLANEOUS) ×1 IMPLANT
ELECTRODE REM PT RTRN 9FT ADLT (ELECTROSURGICAL) ×1 IMPLANT
GLOVE BIOGEL PI IND STRL 8 (GLOVE) ×1 IMPLANT
GLOVE BIOGEL PI INDICATOR 8 (GLOVE) ×2
GLOVE ECLIPSE 8.5 STRL (GLOVE) ×3 IMPLANT
GLOVE ORTHO TXT STRL SZ7.5 (GLOVE) ×3 IMPLANT
GLOVE SURG 8.5 LATEX PF (GLOVE) ×3 IMPLANT
GOWN STRL REUS W/ TWL LRG LVL3 (GOWN DISPOSABLE) ×1 IMPLANT
GOWN STRL REUS W/TWL 2XL LVL3 (GOWN DISPOSABLE) ×6 IMPLANT
GOWN STRL REUS W/TWL LRG LVL3 (GOWN DISPOSABLE) ×2
KIT BASIN OR (CUSTOM PROCEDURE TRAY) ×3 IMPLANT
KIT ROOM TURNOVER OR (KITS) ×3 IMPLANT
NEEDLE SPNL 18GX3.5 QUINCKE PK (NEEDLE) ×6 IMPLANT
NS IRRIG 1000ML POUR BTL (IV SOLUTION) ×3 IMPLANT
PACK LAMINECTOMY ORTHO (CUSTOM PROCEDURE TRAY) ×3 IMPLANT
PAD ARMBOARD 7.5X6 YLW CONV (MISCELLANEOUS) ×6 IMPLANT
PATTIES SURGICAL .5 X.5 (GAUZE/BANDAGES/DRESSINGS) IMPLANT
PATTIES SURGICAL .75X.75 (GAUZE/BANDAGES/DRESSINGS) ×3 IMPLANT
SPONGE LAP 4X18 X RAY DECT (DISPOSABLE) ×6 IMPLANT
SPONGE SURGIFOAM ABS GEL 100 (HEMOSTASIS) ×3 IMPLANT
STRIP CLOSURE SKIN 1/2X4 (GAUZE/BANDAGES/DRESSINGS) ×2 IMPLANT
SUT VIC AB 1 CTX 36 (SUTURE) ×4
SUT VIC AB 1 CTX36XBRD ANBCTR (SUTURE) ×2 IMPLANT
SUT VIC AB 2-0 CT1 27 (SUTURE) ×4
SUT VIC AB 2-0 CT1 TAPERPNT 27 (SUTURE) ×2 IMPLANT
SUT VIC AB 3-0 X1 27 (SUTURE) ×6 IMPLANT
SUT VICRYL 0 UR6 27IN ABS (SUTURE) ×6 IMPLANT
SYR 20CC LL (SYRINGE) ×3 IMPLANT
SYR CONTROL 10ML LL (SYRINGE) ×3 IMPLANT
TOWEL OR 17X24 6PK STRL BLUE (TOWEL DISPOSABLE) ×3 IMPLANT
TOWEL OR 17X26 10 PK STRL BLUE (TOWEL DISPOSABLE) ×3 IMPLANT
WATER STERILE IRR 1000ML POUR (IV SOLUTION) ×3 IMPLANT

## 2016-07-16 NOTE — Interval H&P Note (Signed)
History and Physical Interval Note:  07/16/2016 10:32 AM  Edward Stanley  has presented today for surgery, with the diagnosis of L5-S1 central herniated disc  The various methods of treatment have been discussed with the patient and family. After consideration of risks, benefits and other options for treatment, the patient has consented to  Procedure(s): Bilateral L5-S1 MICRODISCECTOMY (N/A) as a surgical intervention .  The patient's history has been reviewed, patient examined, no change in status, stable for surgery.  I have reviewed the patient's chart and labs.  Questions were answered to the patient's satisfaction.     Kerrin ChampagneJames E Nitka

## 2016-07-16 NOTE — Anesthesia Procedure Notes (Signed)
Procedure Name: Intubation Date/Time: 07/16/2016 1:24 PM Performed by: Rica Koyanagi Pre-anesthesia Checklist: Patient identified, Emergency Drugs available, Suction available and Patient being monitored Patient Re-evaluated:Patient Re-evaluated prior to inductionOxygen Delivery Method: Circle system utilized Preoxygenation: Pre-oxygenation with 100% oxygen Intubation Type: IV induction Ventilation: Mask ventilation without difficulty Laryngoscope Size: Mac and 3 Grade View: Grade I Tube type: Oral Tube size: 7.0 mm Number of attempts: 1 Airway Equipment and Method: Stylet Placement Confirmation: ETT inserted through vocal cords under direct vision,  positive ETCO2 and breath sounds checked- equal and bilateral Secured at: 23 cm Tube secured with: Tape Dental Injury: Teeth and Oropharynx as per pre-operative assessment

## 2016-07-16 NOTE — Anesthesia Preprocedure Evaluation (Signed)
Anesthesia Evaluation  Patient identified by MRN, date of birth, ID band Patient awake    Reviewed: Allergy & Precautions, NPO status , Patient's Chart, lab work & pertinent test results  Airway Mallampati: I       Dental  (+) Teeth Intact   Pulmonary neg pulmonary ROS,    breath sounds clear to auscultation       Cardiovascular hypertension, negative cardio ROS   Rhythm:Regular Rate:Normal     Neuro/Psych negative neurological ROS  negative psych ROS   GI/Hepatic negative GI ROS, Neg liver ROS, GERD  ,  Endo/Other  negative endocrine ROS  Renal/GU negative Renal ROS  negative genitourinary   Musculoskeletal negative musculoskeletal ROS (+)   Abdominal   Peds negative pediatric ROS (+)  Hematology negative hematology ROS (+)   Anesthesia Other Findings   Reproductive/Obstetrics negative OB ROS                             Anesthesia Physical Anesthesia Plan  ASA: I  Anesthesia Plan: General   Post-op Pain Management:    Induction: Intravenous  Airway Management Planned: Oral ETT  Additional Equipment:   Intra-op Plan:   Post-operative Plan: Extubation in OR  Informed Consent: I have reviewed the patients History and Physical, chart, labs and discussed the procedure including the risks, benefits and alternatives for the proposed anesthesia with the patient or authorized representative who has indicated his/her understanding and acceptance.   Dental advisory given  Plan Discussed with: CRNA  Anesthesia Plan Comments:         Anesthesia Quick Evaluation

## 2016-07-16 NOTE — Transfer of Care (Signed)
Immediate Anesthesia Transfer of Care Note  Patient: Edward CumminsBradley Stanley  Procedure(s) Performed: Procedure(s): Bilateral L5-S1 MICRODISCECTOMY (N/A)  Patient Location: PACU  Anesthesia Type:General  Level of Consciousness: awake, alert  and oriented  Airway & Oxygen Therapy: Patient Spontanous Breathing and Patient connected to nasal cannula oxygen  Post-op Assessment: Report given to RN and Patient moving all extremities X 4  Post vital signs: Reviewed and stable  Last Vitals:  Vitals:   07/16/16 1025  BP: 139/88  Pulse: 77  Resp: 20  Temp: 37.2 C    Last Pain:  Vitals:   07/16/16 1025  TempSrc: Oral      Patients Stated Pain Goal: 7 (07/16/16 1052)  Complications: No apparent anesthesia complications

## 2016-07-16 NOTE — Interval H&P Note (Signed)
History and Physical Interval Note:  07/16/2016 10:37 AM  Edward CumminsBradley Stanley  has presented today for surgery, with the diagnosis of L5-S1 central herniated disc  The various methods of treatment have been discussed with the patient and family. After consideration of risks, benefits and other options for treatment, the patient has consented to  Procedure(s): Bilateral L5-S1 MICRODISCECTOMY (N/A) as a surgical intervention .  The patient's history has been reviewed, patient examined, no change in status, stable for surgery.  I have reviewed the patient's chart and labs.  Questions were answered to the patient's satisfaction.     Kerrin ChampagneJames E Nitka

## 2016-07-16 NOTE — Op Note (Signed)
07/16/2016  3:20 PM  PATIENT:  Edward Stanley  36 y.o. male  MRN: [id number]  OPERATIVE REPORT  PRE-OPERATIVE DIAGNOSIS:  L5-S1 central herniated disc  POST-OPERATIVE DIAGNOSIS:  L5-S1 central herniated disc  PROCEDURE:  Procedure(s): Bilateral L5-S1 MICRODISCECTOMY    SURGEON:  Jessy Oto, MD     ASSISTANT:  Benjiman Core, PA-C  (Present throughout the entire procedure and necessary for completion of procedure in a timely manner)     ANESTHESIA:  General,supplemented with local marcaine 0.5% 1:1 exparel 1.3% total 30CC, Dr. Orene Desanctis.    COMPLICATIONS:  None.     EBL [place]  PROCEDURE:The patient was met in the holding area, and the appropriate bilateral Lumbar level L5-S1 identified and marked with "x" and my initials.The patient was then transported to OR and was placed under general anesthesia without difficulty. The patient received appropriate preoperative antibiotic prophylaxis. The patient after intubation atraumatically was transferred to the operating room table, prone position, Wilson frame, sliding OR table. All pressure points were well padded. The arms in 90-90 well-padded at the elbows. Standard prep with DuraPrep solution lower dorsal spine to the mid sacral segment. Draped in the usual manner iodine Vi-Drape was used. Time-out procedure was called and correct. 2x 18-gauge spinal needle was then inserted at the expected L5-S1 level.  Cross table lateral radiograph was  used to identify the spinal needles positions. The needles were at the upper aspect of the lamina of S1 and posterior to the lamina of L5. Skin between the needles was then infiltrated with local marcaine 0.5% 1:1 exparel 1.3% total of 20 cc used. An incision approximately an inch inch and a half in length was then made through skin and subcutaneous layers in line with the left side of the expected midline just superior to the spinal needle entry point. An incision made into bilateral lumbosacral fascia lateral  to the spinous process of L5 and S1 approximately an inch in length .   Cobb elevator was then introduced into the incision site and used to carefully form subperiosteal movement of the  paralumbar muscles off of the posterior lamina of the expected L5-S1 level. The depth measured off of the elevators at about 60 mm and 60 mm retractors and placed on the scaffolding for the [facility] equipment and  down to and docking on the posterior aspect of the lamina at the expected L5-S1 level. Cross table radiograph was used to identified a Penfield #4 at the appropriate level [id number]. The operating room microscope sterilely draped brought into the field. Under the operating room microscope, the L5-S1 interspace carefully debrided the small amount of muscle attachment here and high-speed bur used to drill the medial aspect of the inferior articular process of L5 bilaterally approximately 10%.3 mm Kerrison then used to enter the spinal canal over the superior aspect of the left S1 lamina carefully using the Kerrison to debris the attachment as a curet. Foraminotomy was then performed over theS1 nerve root. The medial 10% superior articular process of S1 and then resected using 2 mm Kerrison.Then moving to the right side of the OR table a 3 mm Kerrison then used to enter the spinal canal over the superior aspect of the left S1 lamina carefully using the Kerrison to debris the attachment as a curet. Foraminotomy was then performed over the left S1 nerve root. The medial 10% superior articular process of S1 and then resected using 2 mm Kerrison.  This allowed for identification of the thecal sac. Penfield  4 was then used to carefully mobilize the thecal sac medially and the S1 nerve root identified within the lateral recess flattened over the posterior aspect of the herniated disc. Carefully the lateral aspect of the S1 nerve root was identified and a Penfield 4 was used to mobilize the nerve medially such that the  herniated disc was visible with microscope. Using a Penfield 4 for retraction and a 15 blade scalpel was used to incise the posterior longitudinal ligament within the lateral recess on the left side longitudinally. Disc material immediately extruded and this was removed using micropituitary rongeurs and nerve hook nerve root and then more easily able to be mobilized medially and retracted using a love retractor. Further foraminotomies was performed over the L5 nerve root the nerve root was noted to be X. without further compression. The nerve root able to be retracted along the medial aspect of the S1pedicle and disc material found to be subligamentous at this level was further resected current pituitary rongeurs. Ligamentum flavum was further debrided superiorly to the level L5-S1 disc. Had a small amount of further resection of the S1 lamina inferiorly was performed. With this then the disc space at L5-S1 was easily visualized and entry into the disc at the right side of the central disc herniation was possible using a Penfield 4 intraoperative. Micropituitary was used to further debride this material superficially from the posterior aspect of the intervertebral disc and right posterior lateral aspect of the disc. Large amount of further disc material was found subligamentous extending posteriorly from the disc this was removed using micropituitary rongeurs. Ligamentum flavum was debrided and lateral recess along the medial aspect of the right L5-S1 facet no further decompression was necessary. Ball tip nerve probe was then able to carefully palpate the neuroforamen for L5and S1 finding these to be well decompressed. Then returning to the left side laminotomy area and L5 S1 the lateral aspect of the thecal sac and left S1 nerve root were retracted medially and the disc herniation was examined. Using a Woodson and then the central portions of the disc were probed and a large fragment was able to be freed and  pushed across the midline to the right side the rent centrally within the disc at the L5-S1 level showed no further fragments were subligamentous disc protrusion to the left side. Returning to the right side then the right thecal sac and S1 nerve root were retracted medially and using pituitary Stann [place] very large fragment was able to be resected on the opposite left side. This space was explored for any free fragments and none were found to be present irrigation was carried out gaining both the left and right laminotomy areas with copious amounts of small vein was found to be tethering the right S1 nerve root this was lifted and bipolar electrocautery applied then divided with 15 blade scalpel. 1 nerve roots were found to be exiting freely without any further nerve compression S1 foramen probe freely with a Woodson probe. Bleeding was then controlled using thrombin-soaked Gelfoam small cottonoids.  Small amount of bleeding within the soft tissue mass the laminotomy area was controlled using bipolar electrocautery. Irrigation was carried out using copious amounts of irrigant solution. All Gelfoam  were then removed. No significant active bleeding present at the time of removal. All instruments sponge counts were correct traction system was then carefully removed carefully rotating retractors with this withdrawal and only bipolar electrocautery of any small bleeders. Lumbodorsal fascia was then carefully approximated with interrupted #  1 Vicryl sutures, UR 6 needle deep subcutaneous layers were approximated with interrupted 0 Vicryl sutures on UR 6 the appear subcutaneous layers approximated with interrupted 2-0 Vicryl sutures and the skin closed with a running subcutaneous stitch of 4-0 Vicryl. Dermabond was applied allowed to dry and then Mepilex bandage applied. Patient was then carefully returned to supine position on a stretcher, reactivated and extubated. He was then returned to recovery room in satisfactory  condition.  Benjiman Core, PA-C perform the duties of assistant surgeon during this case. he was present from the beginning of the case to the end of the case assisting in transfer the patient from his stretcher to the OR table and back to the stretcher at the end of the case. Assisted in careful retraction and suction of the laminectomy site delicate neural structures operating under the operating room microscope. He performed closure of the incision from the fascia to the skin applying the dressing.     Jessy Oto 07/16/2016, 3:20 PM

## 2016-07-16 NOTE — Discharge Instructions (Signed)
° ° °  No lifting greater than 10 lbs. °Avoid bending, stooping and twisting. °Walk in house for first week them may start to get out slowly increasing distance up to one half mile by 4 weeks post op. °Keep incision dry for 3 days, may use tegaderm or similar water impervious dressing. ° °

## 2016-07-16 NOTE — Brief Op Note (Signed)
07/16/2016  3:13 PM  PATIENT:  Edward CumminsBradley Stanley  36 y.o. male  PRE-OPERATIVE DIAGNOSIS:  L5-S1 central herniated disc  POST-OPERATIVE DIAGNOSIS:  L5-S1 central herniated disc  PROCEDURE:  Procedure(s): Bilateral L5-S1 MICRODISCECTOMY (N/A)  SURGEON:  Surgeon(s) and Role:    * Kerrin ChampagneJames E Nitka, MD - Primary  PHYSICIAN ASSISTANT:James Barry Dieneswens, PA-C    ANESTHESIA:   local and general, Dr. Jacklynn BueMassagee.  EBL:  Total I/O In: 1000 [I.V.:1000] Out: 50 [Blood:50]  BLOOD ADMINISTERED:none  DRAINS: none   LOCAL MEDICATIONS USED:  MARCAINE 0.%% 1:1 EXPAREL 1.3% Amount:30 ml  SPECIMEN:  No Specimen  DISPOSITION OF SPECIMEN:  N/A  COUNTS:  YES  TOURNIQUET:  * No tourniquets in log *  DICTATION: .Dragon Dictation  PLAN OF CARE: Admit for overnight observation  PATIENT DISPOSITION:  PACU - hemodynamically stable.   Delay start of Pharmacological VTE agent (>24hrs) due to surgical blood loss or risk of bleeding: yes

## 2016-07-16 NOTE — H&P (Signed)
Edward Stanley is an 36 y.o. male.   Chief Complaint: Low back pain and right lower extremity radiculopathy  HPI: Patient with history of large L5-S1 HNP and the above complaint presents for surgical intervention. He's had progressively worsening symptoms. Failed conservative treatment with pain medication, ESI.  Past Medical History:  Diagnosis Date  . GERD (gastroesophageal reflux disease)   . HNP (herniated nucleus pulposus)    L5-S1  . HTN (hypertension)     Past Surgical History:  Procedure Laterality Date  . LAPAROSCOPIC APPENDECTOMY N/A 08/19/2014   Procedure: APPENDECTOMY LAPAROSCOPIC;  Surgeon: Edward Rudd, MD;  Location: [facility] OR;  Service: General;  Laterality: N/A;  . None      Family History  Problem Relation Age of Onset  . Heart disease Father   . Heart attack Paternal Grandfather   . Prostate cancer Paternal Grandfather   . Breast cancer Paternal Grandmother    Social History:  reports that he has never smoked. He has never used smokeless tobacco. He reports that he drinks about 4.2 oz of alcohol per week . He reports that he does not use drugs.  Allergies: No Known Allergies  No prescriptions prior to admission.    No results found for this or any previous visit (from the past 48 hour(s)). Xr Lumb Spine Flex&ext Only  Result Date: 07/15/2016 Lateral flexion and extension radiographs show DDD L5-S1 with disc space narrowing, no fracture dislocation or subluxaation.   Review of Systems  Constitutional: Negative.   HENT: Negative.   Respiratory: Negative.   Cardiovascular: Negative.   Gastrointestinal: Negative.   Genitourinary: Negative.   Musculoskeletal: Positive for back pain.  Skin: Negative.   Neurological: Positive for tingling.  Psychiatric/Behavioral: Negative.     There were no vitals taken for this visit. Physical Exam  Constitutional: He is oriented to person, place, and time. He appears well-developed. No distress.  HENT:  Head:  Normocephalic and atraumatic.  Eyes: EOM are normal. Pupils are equal, round, and reactive to light.  Neck: Normal range of motion.  Respiratory: No respiratory distress.  GI: He exhibits no distension.  Musculoskeletal:  Gait antalgic. Decreased lumbar flexion and extension due to pain. Sciatic notch tenderness. Positive right straight leg raise.  Neurological: He is alert and oriented to person, place, and time.  Skin: Skin is warm and dry.  Psychiatric: He has a normal mood and affect.     Study Result   CLINICAL DATA:  Initial evaluation for severe low back pain.  EXAM: MRI LUMBAR SPINE WITHOUT CONTRAST  TECHNIQUE: Multiplanar, multisequence MR imaging of the lumbar spine was performed. No intravenous contrast was administered.  COMPARISON:  Prior radiograph from 07/01/2016.  FINDINGS: Segmentation: Normal segmentation. Lowest well-formed disc is labeled the L5-S1 level.  Alignment: Vertebral bodies normally aligned with preservation of the normal lumbar lordosis. No listhesis.  Vertebrae: Vertebral body heights are maintained. No evidence for acute or chronic fracture. Reactive endplate changes present about the L5-S1 interspace. Signal intensity within the vertebral body bone marrow otherwise normal. No focal osseous lesions. No abnormal marrow edema.  Conus medullaris: Extends to the L1 level and appears normal.  Paraspinal and other soft tissues: Paraspinous soft tissues are within normal limits. Visualized visceral structures are unremarkable.  Disc levels:  L1-2:  Unremarkable.  L2-3:  Unremarkable.  L3-4:  Normal disc.  Mild bilateral facet hypertrophy.  No stenosis.  L4-5: Small central disc protrusion with associated annular fissure. Protruding disc minimally indents the ventral thecal sac without  significant stenosis. Minimal facet hypertrophy. Foramina remain patent.  L5-S1: Central disc extrusion with slight caudad  angulation. Protruding indents the ventral thecal sac and encroaches upon the bilateral lateral recesses, slightly worse on the right. Resultant fairly severe canal stenosis of the distal thecal sac (series 8, image 36). Protruding disc could potentially effect either the S1 nerve roots. Mild bilateral foraminal narrowing.  IMPRESSION: 1. Central disc extrusion at L5-S1 with resultant severe canal and bilateral lateral recess stenosis. Protruding disc could potentially affect either of the S1 nerve roots. 2. Small central disc protrusion at L4-5 with associated annular fissure. No stenosis or neural impingement. 3. Mild multilevel facet hypertrophy within the lower lumbar spine.   Electronically Signed   By: Rise MuBenjamin  Stanley M.D.   On: 07/05/2016 17:31    Assessment/Plan L5-S1 HNP, low back pain and right lower extremity radiculopathy  We'll proceed with bilateral L5-S1 microdiscectomy as scheduled. Surgical procedure along with potential rehabilitation/recovery time discussed. All questions answered.  Zonia KiefJames Owens, PA-C 07/16/2016, 9:24 AM

## 2016-07-17 ENCOUNTER — Encounter (HOSPITAL_COMMUNITY): Payer: Self-pay | Admitting: Specialist

## 2016-07-17 DIAGNOSIS — M5117 Intervertebral disc disorders with radiculopathy, lumbosacral region: Secondary | ICD-10-CM | POA: Diagnosis not present

## 2016-07-17 MED FILL — Thrombin For Soln 20000 Unit: CUTANEOUS | Qty: 1 | Status: AC

## 2016-07-17 NOTE — Progress Notes (Signed)
Patient alert and oriented, mae's well, voiding adequate amount of urine, swallowing without difficulty, no c/o pain at time of discharge. Patient discharged home with family. Script and discharged instructions given to patient. Patient and family stated understanding of instructions given. Patient has an appointment with Dr. Nitka 

## 2016-07-17 NOTE — Anesthesia Postprocedure Evaluation (Addendum)
Anesthesia Post Note  Patient: Edward CumminsBradley Bottomley  Procedure(s) Performed: Procedure(s) (LRB): Bilateral L5-S1 MICRODISCECTOMY (N/A)  Patient location during evaluation: PACU Anesthesia Type: General Level of consciousness: awake and sedated Pain management: pain level controlled Vital Signs Assessment: post-procedure vital signs reviewed and stable Respiratory status: spontaneous breathing, nonlabored ventilation, respiratory function stable and patient connected to nasal cannula oxygen Cardiovascular status: blood pressure returned to baseline and stable Postop Assessment: no signs of nausea or vomiting Anesthetic complications: no       Last Vitals:  Vitals:   07/17/16 0829 07/17/16 0833  BP: (!) 141/103 (!) 137/104  Pulse: 61 65  Resp: 20 20  Temp: 36.6 C     Last Pain:  Vitals:   07/17/16 0829  TempSrc: Oral  PainSc:                  Braxxton Stoudt,JAMES TERRILL

## 2016-07-17 NOTE — Progress Notes (Signed)
     Subjective: 1 Day Post-Op Procedure(s) (LRB): Bilateral L5-S1 MICRODISCECTOMY (N/A)  Patient reports pain as mild.    Objective:   VITALS:  Temp:  [97.9 F (36.6 C)-99 F (37.2 C)] 98 F (36.7 C) (03/28 0415) Pulse Rate:  [59-101] 59 (03/28 0415) Resp:  [14-20] 20 (03/28 0415) BP: (130-145)/(82-93) 137/83 (03/28 0415) SpO2:  [95 %-99 %] 99 % (03/28 0415)  Neurologically intact ABD soft Neurovascular intact Intact pulses distally Dorsiflexion/Plantar flexion intact Incision: scant drainage Decreased sensation right S1, decreased right ankle reflex.  LABS  Recent Labs  07/16/16 1017  HGB 13.9  WBC 4.7  PLT 191    Recent Labs  07/16/16 1017  NA 139  K 4.3  CL 103  CO2 26  BUN 17  CREATININE 0.79  GLUCOSE 99   No results for input(s): LABPT, INR in the last 72 hours.   Assessment/Plan: 1 Day Post-Op Procedure(s) (LRB): Bilateral L5-S1 MICRODISCECTOMY (N/A)  Advance diet Up with therapy D/C IV fluids Discharge home with home health  Kerrin ChampagneJames E Nitka 07/17/2016, 7:25 AM Patient ID: Verdon CumminsBradley Procell, male   DOB: 04/20/1981, 36 y.o.   MRN: [id number]

## 2016-07-17 NOTE — Progress Notes (Signed)
OT Cancellation Note  Patient Details Name: Edward Stanley MRN: [id number] DOB: 06/14/1980   Cancelled Treatment:     OT screening patient based on no acute needs at this time.  Felecia ShellingJones, Stacy Sailer B   Taia Bramlett, Brynn   OTR/L Pager: [phone] Office: [phone] .  07/17/2016, 9:33 AM

## 2016-07-17 NOTE — Evaluation (Signed)
Physical Therapy Evaluation and Discharge Patient Details Name: Edward CumminsBradley Stanley MRN: [id number] DOB: 11/24/1980 Today's Date: 07/17/2016   History of Present Illness  Pt is a 36 y/o male who presents s/p L5-S1 microdiscectomy on 07/16/16.  Clinical Impression  Patient evaluated by Physical Therapy with no further acute PT needs identified. All education has been completed and the patient has no further questions. At the time of PT eval pt was mobilizing at a grossly independent level. Focus of session was stair training and education for pt/family. See below for any follow-up Physical Therapy or equipment needs. PT is signing off. Thank you for this referral.     Follow Up Recommendations No PT follow up    Equipment Recommendations  3in1 (PT) (Pt reports will get his own)    Recommendations for Other Services       Precautions / Restrictions Precautions Precautions: Back Precaution Booklet Issued: Yes (comment) Precaution Comments: Discussed in detail Restrictions Weight Bearing Restrictions: No      Mobility  Bed Mobility Overal bed mobility: Independent                Transfers Overall transfer level: Independent Equipment used: None                Ambulation/Gait Ambulation/Gait assistance: Independent Ambulation Distance (Feet): 400 Feet Assistive device: None Gait Pattern/deviations: WFL(Within Functional Limits)   Gait velocity interpretation: at or above normal speed for age/gender    Stairs Stairs: Yes Stairs assistance: Modified independent (Device/Increase time) Stair Management: No rails Number of Stairs: 10    Wheelchair Mobility    Modified Rankin (Stroke Patients Only)       Balance Overall balance assessment: No apparent balance deficits (not formally assessed)                                           Pertinent Vitals/Pain Pain Assessment: Faces Faces Pain Scale: Hurts a little bit Pain Location:  incision Pain Descriptors / Indicators: Operative site guarding Pain Intervention(s): Limited activity within patient's tolerance;Monitored during session;Repositioned    Home Living Family/patient expects to be discharged to:: Private residence Living Arrangements: Spouse/significant other;Children Available Help at Discharge: Family;Available 24 hours/day Type of Home: House Home Access: Stairs to enter Entrance Stairs-Rails: Right Entrance Stairs-Number of Steps: 3 Home Layout: Two level;Able to live on main level with bedroom/bathroom Home Equipment: None      Prior Function Level of Independence: Independent               Hand Dominance        Extremity/Trunk Assessment   Upper Extremity Assessment Upper Extremity Assessment: Overall WFL for tasks assessed    Lower Extremity Assessment Lower Extremity Assessment: Overall WFL for tasks assessed    Cervical / Trunk Assessment Cervical / Trunk Assessment: Other exceptions Cervical / Trunk Exceptions: s/p surgery  Communication   Communication: No difficulties  Cognition Arousal/Alertness: Awake/alert Behavior During Therapy: WFL for tasks assessed/performed Overall Cognitive Status: Within Functional Limits for tasks assessed                                        General Comments      Exercises     Assessment/Plan    PT Assessment Patent does not need any further PT services  PT Problem List         PT Treatment Interventions      PT Goals (Current goals can be found in the Care Plan section)  Acute Rehab PT Goals PT Goal Formulation: All assessment and education complete, DC therapy    Frequency     Barriers to discharge        Co-evaluation               End of Session   Activity Tolerance: Patient tolerated treatment well Patient left: in chair;with call bell/phone within reach;with family/visitor present Nurse Communication: Mobility status PT Visit  Diagnosis: Pain Pain - part of body:  (Back)    Time: 1610-9604 PT Time Calculation (min) (ACUTE ONLY): 32 min   Charges:   PT Evaluation $PT Eval Moderate Complexity: 1 Procedure PT Treatments $Gait Training: 8-22 mins   PT G Codes:   PT G-Codes **NOT FOR INPATIENT CLASS** Functional Assessment Tool Used: Clinical judgement;AM-PAC 6 Clicks Basic Mobility Functional Limitation: Mobility: Walking and moving around Mobility: Walking and Moving Around Current Status ([id number]): At least 1 percent but less than 20 percent impaired, limited or restricted Mobility: Walking and Moving Around Goal Status [phone]): At least 1 percent but less than 20 percent impaired, limited or restricted Mobility: Walking and Moving Around Discharge Status [phone]): At least 1 percent but less than 20 percent impaired, limited or restricted    Conni Slipper, PT, DPT Acute Rehabilitation Services Pager: [phone]   Marylynn Pearson 07/17/2016, 10:44 AM

## 2016-07-23 ENCOUNTER — Telehealth (INDEPENDENT_AMBULATORY_CARE_PROVIDER_SITE_OTHER): Payer: Self-pay | Admitting: *Deleted

## 2016-07-23 NOTE — Telephone Encounter (Signed)
Pt calling stating he had a flight today and pt is requesting a letter stating that he cannot fly due to surgery.

## 2016-07-23 NOTE — Telephone Encounter (Signed)
Pt calling stating he had a flight today and pt is requesting a letter stating that he cannot fly due to surgery.  

## 2016-07-23 NOTE — Discharge Summary (Signed)
Physician Discharge Summary      Patient ID: Edward Stanley MRN: [id number] DOB/AGE: 1980-07-30 36 y.o.  Admit date: 07/16/2016 Discharge date: 07/17/2016  Admission Diagnoses:  Principal Problem:   Herniation of lumbar intervertebral disc Active Problems:   Herniated intervertebral disc of lumbar spine   Discharge Diagnoses:  Same  Past Medical History:  Diagnosis Date  . GERD (gastroesophageal reflux disease)   . HNP (herniated nucleus pulposus)    L5-S1  . HTN (hypertension)     Surgeries: Procedure(s): Bilateral L5-S1 MICRODISCECTOMY on 07/16/2016   Consultants:   Discharged Condition: Improved  Hospital Course: Edward Stanley is an 36 y.o. male who was admitted 07/16/2016 with a chief complaint of No chief complaint on file. , and found to have a diagnosis of Herniation of lumbar intervertebral disc.  He was brought to the operating room on 07/16/2016 and underwent the above named procedures.     He was given perioperative antibiotics:  Anti-infectives    Start     Dose/Rate Route Frequency Ordered Stop   07/16/16 2130  ceFAZolin (ANCEF) IVPB 2g/100 mL premix     2 g 200 mL/hr over 30 Minutes Intravenous Every 8 hours 07/16/16 1647 07/17/16 0459   07/16/16 1015  ceFAZolin (ANCEF) IVPB 2g/100 mL premix     2 g 200 mL/hr over 30 Minutes Intravenous On call to O.R. 07/16/16 1011 07/16/16 1330    He recovered in the PACU, complaining of some numbness in the posterior and  plantar aspect of the right leg and foot. He was transferred to meg-surg floor and there he demonstrated normal motor but persistent numbness in the right S1 distribution. Dressing was changed on POD#1 with scant discharge. Legs with normal motor but some persistent anesthesia in the right S1 distribution, the right ankle jerk was Absent whereas preoperatively it was 1/2-1plus. Discussed the causes for this which include manipulation of the S1 root for mobilization and excision of the large central  herniated disc.  He was taking and tolerating oral medications and nourishment. He was discharged home on POD#1.    He was given sequential compression devices and early ambulation for DVT prophylaxis.  They benefited maximally from their hospital stay and there were no complications.    Recent vital signs:  Vitals:   07/17/16 0829 07/17/16 0833  BP: (!) 141/103 (!) 137/104  Pulse: 61 65  Resp: 20 20  Temp: 97.8 F (36.6 C)     Recent laboratory studies:  Results for orders placed or performed during the hospital encounter of 07/16/16  Basic metabolic panel  Result Value Ref Range   Sodium 139 135 - 145 mmol/L   Potassium 4.3 3.5 - 5.1 mmol/L   Chloride 103 101 - 111 mmol/L   CO2 26 22 - 32 mmol/L   Glucose, Bld 99 65 - 99 mg/dL   BUN 17 6 - 20 mg/dL   Creatinine, Ser 4.09 0.61 - 1.24 mg/dL   Calcium 9.6 8.9 - 81.1 mg/dL   GFR calc non Af Amer >60 >60 mL/min   GFR calc Af Amer >60 >60 mL/min   Anion gap 10 5 - 15  CBC  Result Value Ref Range   WBC 4.7 4.0 - 10.5 K/uL   RBC 4.70 4.22 - 5.81 MIL/uL   Hemoglobin 13.9 13.0 - 17.0 g/dL   HCT 91.4 78.2 - 95.6 %   MCV 85.5 78.0 - 100.0 fL   MCH 29.6 26.0 - 34.0 pg   MCHC 34.6 30.0 -  36.0 g/dL   RDW 82.9 56.2 - 13.0 %   Platelets 191 150 - 400 K/uL    Discharge Medications:   Allergies as of 07/17/2016   No Known Allergies     Medication List    TAKE these medications   DEXILANT 60 MG capsule Generic drug:  dexlansoprazole Take 60 mg by mouth daily.   HYDROcodone-acetaminophen 7.5-325 MG tablet Commonly known as:  NORCO Take 1 tablet by mouth every 6 (six) hours as needed for moderate pain.   lisinopril-hydrochlorothiazide 20-12.5 MG tablet Commonly known as:  PRINZIDE,ZESTORETIC TAKE 2 TABLET BY MOUTH DAILY.   methocarbamol 500 MG tablet Commonly known as:  ROBAXIN Take 1 tablet (500 mg total) by mouth every 6 (six) hours as needed for muscle spasms.   oxyCODONE-acetaminophen 7.5-325 MG tablet Commonly  known as:  PERCOCET Take 1 tablet by mouth every 4 (four) hours as needed for severe pain.   PROBIOTIC DAILY Caps Take 1 capsule by mouth daily.       Diagnostic Studies: Dg Lumbar Spine 2-3 Views  Result Date: 07/16/2016 CLINICAL DATA:  36 year old male undergoing lumbar surgery. EXAM: LUMBAR SPINE - 2-3 VIEW COMPARISON:  Lumbar MRI 07/05/2016. Outside Lumbar radiographs 07/01/2016 FINDINGS: Intraoperative portable cross-table lateral views of the lumbar spine. Normal lumbar segmentation demonstrated on the prior radiographs and that is the same numbering system used on the 07/05/2016 MRI. Image labeled #1 at 1330 hours. Posterior needles directed at the L4-L5 and L5-S1 disc spaces. Film labeled #2 at 1330 hours. Surgical probe directed toward the L5-S1 disc space. IMPRESSION: Intraoperative localization as above. Electronically Signed   By: Odessa Fleming M.D.   On: 07/16/2016 14:06   Mr Lumbar Spine W/o Contrast  Result Date: 07/05/2016 CLINICAL DATA:  Initial evaluation for severe low back pain. EXAM: MRI LUMBAR SPINE WITHOUT CONTRAST TECHNIQUE: Multiplanar, multisequence MR imaging of the lumbar spine was performed. No intravenous contrast was administered. COMPARISON:  Prior radiograph from 07/01/2016. FINDINGS: Segmentation: Normal segmentation. Lowest well-formed disc is labeled the L5-S1 level. Alignment: Vertebral bodies normally aligned with preservation of the normal lumbar lordosis. No listhesis. Vertebrae: Vertebral body heights are maintained. No evidence for acute or chronic fracture. Reactive endplate changes present about the L5-S1 interspace. Signal intensity within the vertebral body bone marrow otherwise normal. No focal osseous lesions. No abnormal marrow edema. Conus medullaris: Extends to the L1 level and appears normal. Paraspinal and other soft tissues: Paraspinous soft tissues are within normal limits. Visualized visceral structures are unremarkable. Disc levels: L1-2:   Unremarkable. L2-3:  Unremarkable. L3-4:  Normal disc.  Mild bilateral facet hypertrophy.  No stenosis. L4-5: Small central disc protrusion with associated annular fissure. Protruding disc minimally indents the ventral thecal sac without significant stenosis. Minimal facet hypertrophy. Foramina remain patent. L5-S1: Central disc extrusion with slight caudad angulation. Protruding indents the ventral thecal sac and encroaches upon the bilateral lateral recesses, slightly worse on the right. Resultant fairly severe canal stenosis of the distal thecal sac (series 8, image 36). Protruding disc could potentially effect either the S1 nerve roots. Mild bilateral foraminal narrowing. IMPRESSION: 1. Central disc extrusion at L5-S1 with resultant severe canal and bilateral lateral recess stenosis. Protruding disc could potentially affect either of the S1 nerve roots. 2. Small central disc protrusion at L4-5 with associated annular fissure. No stenosis or neural impingement. 3. Mild multilevel facet hypertrophy within the lower lumbar spine. Electronically Signed   By: Rise Mu M.D.   On: 07/05/2016 17:31   Dg  Inject Diag/thera/inc Needle/cath/plc Epi/lumb/sac W/img  Result Date: 07/09/2016 CLINICAL DATA:  Lumbosacral spondylosis without myelopathy with radiculopathy. Posterior right thigh pain. Lumbar spine MRI demonstrates an L5-S1 disc extrusion with spinal and lateral recess stenosis. FLUOROSCOPY TIME:  Radiation Exposure Index (as provided by the fluoroscopic device): 7.89 microGray*m^2 Fluoroscopy Time (in minutes and seconds):  6 seconds PROCEDURE: The procedure, risks, benefits, and alternatives were explained to the patient. Questions regarding the procedure were encouraged and answered. The patient understands and consents to the procedure. LUMBAR EPIDURAL INJECTION: An interlaminar approach was performed on the right at L5-S1. The overlying skin was cleansed and anesthetized. A 3.5 inch 20 gauge  epidural needle was advanced using loss-of-resistance technique. DIAGNOSTIC EPIDURAL INJECTION: Injection of Isovue-M 200 shows a good epidural pattern with spread above and below the level of needle placement, primarily on the right. No vascular opacification is seen. THERAPEUTIC EPIDURAL INJECTION: 120 mg of Depo-Medrol mixed with 3 mL of 1% lidocaine were instilled. The procedure was well-tolerated, and the patient was discharged thirty minutes following the injection in good condition. COMPLICATIONS: None IMPRESSION: Technically successful lumbar interlaminar epidural injection on the right at L5-S1. Electronically Signed   By: Sebastian Ache M.D.   On: 07/09/2016 15:24   Xr Hip Unilat W Or W/o Pelvis 2-3 Views Right  Result Date: 07/01/2016 AP the pelvis and lateral of the left hip demonstrates possible very early inferior humeral head spur. Joint spaces are well maintained. The pelvis intact.  Xr Lumb Spine Flex&ext Only  Result Date: 07/15/2016 Lateral flexion and extension radiographs show DDD L5-S1 with disc space narrowing, no fracture dislocation or subluxaation.  Xr Lumbar Spine 2-3 Views  Result Date: 07/01/2016 Films of the lumbar spine obtained in 2 projections. Disc spaces are well maintained. No evidence of a spondylolisthesis. Facet joints. Bony details obscured by bowel gas.   Disposition: 01-Home or Self Care  Discharge Instructions    Call MD / Call 911    Complete by:  As directed    If you experience chest pain or shortness of breath, CALL 911 and be transported to the hospital emergency room.  If you develope a fever above 101 F, pus (white drainage) or increased drainage or redness at the wound, or calf pain, call your surgeon's office.   Constipation Prevention    Complete by:  As directed    Drink plenty of fluids.  Prune juice may be helpful.  You may use a stool softener, such as Colace (over the counter) 100 mg twice a day.  Use MiraLax (over the counter) for  constipation as needed.   Diet - low sodium heart healthy    Complete by:  As directed    Discharge instructions    Complete by:  As directed    No lifting greater than 10 lbs. Avoid bending, stooping and twisting. Walk in house for first week them may start to get out slowly increasing distance up to one half mile by 4 weeks post op. Keep incision dry for 3 days, may use tegaderm or similar water impervious dressing.   Driving restrictions    Complete by:  As directed    No driving for 2 weeks   Increase activity slowly as tolerated    Complete by:  As directed    Lifting restrictions    Complete by:  As directed    No lifting for 8 weeks      Follow-up Information    Kerrin Champagne, MD Follow up  in 2 week(s).   Specialty:  Orthopedic Surgery Why:  For wound re-check Contact information: [address] [place] [address] [phone]            Signed: Kerrin Champagne 07/23/2016, 3:25 PM

## 2016-07-24 ENCOUNTER — Encounter (INDEPENDENT_AMBULATORY_CARE_PROVIDER_SITE_OTHER): Payer: Self-pay | Admitting: Specialist

## 2016-07-24 NOTE — Telephone Encounter (Signed)
Letter done. jen

## 2016-07-25 NOTE — Telephone Encounter (Signed)
lmom that letter is at the front desk to be picked up

## 2016-08-01 ENCOUNTER — Ambulatory Visit (INDEPENDENT_AMBULATORY_CARE_PROVIDER_SITE_OTHER): Payer: Self-pay | Admitting: Specialist

## 2016-08-01 ENCOUNTER — Encounter: Payer: Self-pay | Admitting: Physical Therapy

## 2016-08-01 ENCOUNTER — Encounter (INDEPENDENT_AMBULATORY_CARE_PROVIDER_SITE_OTHER): Payer: Self-pay | Admitting: Specialist

## 2016-08-01 ENCOUNTER — Ambulatory Visit: Payer: PRIVATE HEALTH INSURANCE | Attending: Specialist | Admitting: Physical Therapy

## 2016-08-01 VITALS — BP 143/87 | HR 86 | Ht 76.0 in | Wt 225.0 lb

## 2016-08-01 DIAGNOSIS — Z9889 Other specified postprocedural states: Secondary | ICD-10-CM

## 2016-08-01 DIAGNOSIS — M6281 Muscle weakness (generalized): Secondary | ICD-10-CM | POA: Diagnosis present

## 2016-08-01 DIAGNOSIS — M5416 Radiculopathy, lumbar region: Secondary | ICD-10-CM | POA: Diagnosis present

## 2016-08-01 NOTE — Patient Instructions (Addendum)
    No lifting greater than 10 lbs. Avoid bending, stooping and twisting. Walk in house for first week them may start to get out slowly increasing distance up to one mile. Walking in a pool at first later swimming exercise.. Vitamin B complex daily and kreel oil for nerve repair/regeneration 4-6 weeks before normal lifting and bending and riding.

## 2016-08-01 NOTE — Progress Notes (Signed)
   Post-Op Visit Note   Patient: Edward Stanley           Date of Birth: [birth year]           MRN: [id number] Visit Date: 08/01/2016 PCP: Eartha Inch, MD   Assessment & Plan: 2 weeks post bilateral microdiscectomy L5-S1, improved and is seeing improved standing and walking.   Motor is normal, sensory decreased right S1, SLR min positive, Ankle reflex right trace and left 2+. Incision is healed no erythrema or drainage.  Chief Complaint:  Chief Complaint  Patient presents with  . Lower Back - Routine Post Op    2 weeks post Bilateral L5-S1 Microdiscectomy   Visit Diagnoses: No diagnosis found.  Plan: No lifting greater than 10 lbs. Avoid bending, stooping and twisting. Walk in house for first week them may start to get out slowly increasing distance up to one mile. Walking in a pool at first later swimming exercise.. Walking in a pool at first later swimming exercise.. Vitamin B complex daily and kreel oil for nerve repair/regeneration 4-6 weeks before normal lifting and bending and riding.    Follow-Up Instructions: Return in about 4 weeks (around 08/29/2016).   Orders:  No orders of the defined types were placed in this encounter.  No orders of the defined types were placed in this encounter.   Imaging: No results found.  PMFS History: Patient Active Problem List   Diagnosis Date Noted  . Herniation of lumbar intervertebral disc 07/16/2016    Priority: High  . Herniated intervertebral disc of lumbar spine 07/16/2016  . Acute appendicitis without peritonitis 08/19/2014   Past Medical History:  Diagnosis Date  . GERD (gastroesophageal reflux disease)   . HNP (herniated nucleus pulposus)    L5-S1  . HTN (hypertension)     Family History  Problem Relation Age of Onset  . Heart disease Father   . Heart attack Paternal Grandfather   . Prostate cancer Paternal Grandfather   . Breast cancer Paternal Grandmother     Past Surgical History:  Procedure  Laterality Date  . LAPAROSCOPIC APPENDECTOMY N/A 08/19/2014   Procedure: APPENDECTOMY LAPAROSCOPIC;  Surgeon: Manus Rudd, MD;  Location: [facility];  Service: General;  Laterality: N/A;  . LUMBAR LAMINECTOMY N/A 07/16/2016   Procedure: Bilateral L5-S1 MICRODISCECTOMY;  Surgeon: Kerrin Champagne, MD;  Location: [facility] OR;  Service: Orthopedics;  Laterality: N/A;  . None     Social History   Occupational History  . Sales    Social History Main Topics  . Smoking status: Never Smoker  . Smokeless tobacco: Never Used  . Alcohol use 4.2 oz/week    7 Cans of beer per week     Comment: daily  . Drug use: No  . Sexual activity: Not on file

## 2016-08-01 NOTE — Therapy (Signed)
[facility] [address] [place], [place], [address] Phone: [phone]   Fax:  [phone]  Physical Therapy Evaluation  Patient Details  Name: Edward Stanley MRN: [id number] Date of Birth: 11/13/1980 Referring Provider: Dr. Vira Browns  Encounter Date: 08/01/2016      PT End of Session - 08/01/16 1454    Visit Number 1   Date for PT Re-Evaluation 10/24/16   PT Start Time 1400   PT Stop Time 1445   PT Time Calculation (min) 45 min   Activity Tolerance Patient tolerated treatment well   Behavior During Therapy [facility] for tasks assessed/performed      Past Medical History:  Diagnosis Date  . GERD (gastroesophageal reflux disease)   . HNP (herniated nucleus pulposus)    L5-S1  . HTN (hypertension)     Past Surgical History:  Procedure Laterality Date  . LAPAROSCOPIC APPENDECTOMY N/A 08/19/2014   Procedure: APPENDECTOMY LAPAROSCOPIC;  Surgeon: Manus Rudd, MD;  Location: [facility] OR;  Service: General;  Laterality: N/A;  . LUMBAR LAMINECTOMY N/A 07/16/2016   Procedure: Bilateral L5-S1 MICRODISCECTOMY;  Surgeon: Kerrin Champagne, MD;  Location: [facility] OR;  Service: Orthopedics;  Laterality: N/A;  . None      There were no vitals filed for this visit.       Subjective Assessment - 08/01/16 1410    Subjective Patient has lumbar surgery on 07/16/2016. Patient back feels week.  Patient right sciatica pain went away from surgery.  I have numbness in the right heel. Patient has decreased feeling in the hamstring.     How long can you stand comfortably? standing 1 hour back pain   How long can you walk comfortably? no trouble walking   Patient Stated Goals increased strength of back   Currently in Pain? Yes   Pain Score 2    Pain Location Back  morning in right leg with tightness   Pain Orientation Right;Lower   Pain Descriptors / Indicators Tightness   Pain Type Acute pain   Pain Onset More than a month ago   Pain  Frequency Intermittent   Aggravating Factors  standing, morning   Pain Relieving Factors movement   Multiple Pain Sites No            [facility] PT Assessment - 08/01/16 0001      Assessment   Medical Diagnosis Z98.890 s/p lumbar laminectom   Referring Provider Dr. Vira Browns   Onset Date/Surgical Date 07/16/16   Prior Therapy none     Precautions   Precautions Back   Precaution Comments no lifting above 10#; no riding more than 1 hour     Restrictions   Weight Bearing Restrictions No     Balance Screen   Has the patient fallen in the past 6 months No   Has the patient had a decrease in activity level because of a fear of falling?  No   Is the patient reluctant to leave their home because of a fear of falling?  No     Home [employer] information centre manager residence     Prior Function   Level of Independence Independent   Vocation Full time employment   Vocation Requirements sitting in care   Leisure workout 5x/week at home; golfer     Cognition   Overall Cognitive Status Within Functional Limits for tasks assessed     Observation/Other Assessments   Focus on Therapeutic Outcomes (FOTO)  49% limitation  goal  is 30% limitation     Posture/Postural Control   Posture/Postural Control No significant limitations     ROM / Strength   AROM / PROM / Strength AROM;Strength     AROM   Overall AROM Comments lumbar ROM is limited by 50% due to surgery     PROM   Overall PROM Comments --   Right Hip Internal Rotation  40     Strength   Right Hip Flexion 4+/5   Right Hip ABduction 3+/5   Right Knee Extension 4-/5     Right Hip   Right Hip Flexion 105     Flexibility   Soft Tissue Assessment /Muscle Length yes   Hamstrings right popliteal angle 40 degrees   Quadriceps tight on right     Palpation   Palpation comment decreased mobility of scar, scar is red and has a scab     Transfers   Transfers Not assessed     Ambulation/Gait   Ambulation/Gait No                            PT Education - 08/01/16 1453    Education provided Yes   Education Details instruction on precautions for after back surgery; stretches, scar massage   Person(s) Educated Patient   Methods Explanation;Demonstration;Verbal cues;Handout   Comprehension Verbalized understanding;Returned demonstration          PT Short Term Goals - 08/01/16 1513      PT SHORT TERM GOAL #1   Title independent with initial HEP with hamstring stretch, core stabilization   Time 4   Period Weeks   Status New     PT SHORT TERM GOAL #2   Title understand correct body mechanics with daily tasks to decrease strain on lumbar   Time 4   Period Weeks   Status New           PT Long Term Goals - 08/01/16 1514      PT LONG TERM GOAL #1   Title independent with HEP and understand progression   Time 12   Period Weeks   Status New     PT LONG TERM GOAL #2   Title ability to lift at work with correct body mechanics and no increased in pain   Time 12   Period Weeks   Status New     PT LONG TERM GOAL #3   Title walking for 45 minutes due to increased endurance and strength of core   Time 12   Period Weeks   Status New     PT LONG TERM GOAL #4   Title able to perform work duties including riding in car for 1 hour, lifting, and sitting without increase in pain   Time 12   Period Weeks   Status New     PT LONG TERM GOAL #5   Title start a swimming program due to pool opened and no increased pain and improved core strength   Time 12   Period Weeks   Status New     Additional Long Term Goals   Additional Long Term Goals Yes     PT LONG TERM GOAL #6   Title FOTO score is </= 30% limitation   Time 12   Period Weeks   Status New               Plan - 08/01/16 1449    Clinical Impression Statement Patient is  a 36 year old male who had lumbar lamenectomy on 07/16/2016.  Patient reports intermittent pain in lumbar and right hip is 2/10 and  increases with standing. Patient has decreased lumbar ROM by 50% due to recent surgery. Popliteal angle on the right is 40 degrees therefore his hamstrings are tight.  Patient surgical site is red and scab is healing.  Patient has weakness in right leg and core.  Patient reports his back feels like it will break due to weakness.  Patient is low complexity evaluation due to stable condition and comorbidities including s/p lumbar lamenectom that could impact care provided. Patient will benefit from skilled therapy to improve endurance, strength and ability to tolerate daily function.    Clinical Impairments Affecting Rehab Potential s/p lumbar lamenectomy 07/16/2016; No lifting greater for 10# till 08/13/2016, No bedning, twisting, and stooping till 08/13/2016. After 4-6 weeks cna resume normal lifting, bending and riding.    PT Frequency 3x / week   PT Duration 12 weeks   PT Treatment/Interventions Cryotherapy;Electrical Stimulation;Moist Heat;Ultrasound;Patient/family education;Neuromuscular re-education;Therapeutic exercise;Therapeutic activities;Manual techniques;Scar mobilization   PT Next Visit Plan core stabilization low level, modalities as needed, hamstring stretch, aerobic activity, body mechanics   PT Home Exercise Plan body mechainics   Recommended Other Services None   Consulted and Agree with Plan of Care Patient      Patient will benefit from skilled therapeutic intervention in order to improve the following deficits and impairments:  Decreased strength, Decreased mobility, Decreased scar mobility, Improper body mechanics, Decreased activity tolerance, Decreased endurance, Pain, Increased fascial restricitons  Visit Diagnosis: Muscle weakness (generalized) - Plan: PT plan of care cert/re-cert  Radiculopathy, lumbar region - Plan: PT plan of care cert/re-cert     Problem List Patient Active Problem List   Diagnosis Date Noted  . Herniation of lumbar intervertebral disc 07/16/2016   . Herniated intervertebral disc of lumbar spine 07/16/2016  . Acute appendicitis without peritonitis 08/19/2014    Eulis Foster, PT 08/01/16 3:20 PM   [facility] [address] [place], [place], [address] Phone: [phone]   Fax:  [phone]  Name: Edward Stanley MRN: [id number] Date of Birth: 10-22-1980

## 2016-08-01 NOTE — Patient Instructions (Addendum)
Scar Massage  Scar massage is done to improve the mobility of scar, decrease scar tissue from building up, reduce adhesions, and prevent Keloids from forming. Start scar massage after scabs have fallen off by themselves and no open areas. The first few weeks after surgery, it is normal for a scar to appear pink or red and slightly raised. Scars can itch or have areas of numbness. Some scars may be sensitive.   Direct Scar massage: after scar is healed, no opening, no scab 1.  Place pads of two fingers together directly on the scar starting at one end of the scar. Move the fingers up and down across the scar holding 5 seconds one direction.  Then go opposite direction hold 5 seconds.  2. Move over to the next section of the scar and repeat.  Work your way along the entire length of the scar.   3. Next make diagonal movements along the scar holding 5 seconds at one direction. 4. Next movement is side to side. 5. Do not rub fingers over the scar.  Instead keep firm pressure and move scar over the tissue it is on top   Scar Lift and Roll 12 weeks after surgery. 1. Pinch a small amount of the scar between your first two fingers and thumb.  2. Roll the scar between your fingers for 5 to 15 seconds. 3. Move along the scar and repeat until you have massaged the entire length of scar.   Stop the massage and call your doctor if you notice: 1. Increased redness 2. Bleeding from scar 3. Seepage coming from the scar 4. Scar is warmer and has increased pain   Use sun screen during the summer  Chair Sitting    Sit at edge of seat, spine straight, one leg extended. Put a hand on each thigh and bend forward from the hip, keeping spine straight. Allow hand on extended leg to reach toward toes. Support upper body with other arm. Hold _30__ seconds. Repeat _2__ times per session. Do _1__ sessions per day.  Copyright  VHI. All rights reserved.  Supine    Lie on back, legs bent and feet flat. Grasp  behind one leg and slowly try to straighten knee. Hold _30__ seconds.  Repeat _2__ times per session. Do _1__ sessions per day.  Copyright  VHI. All rights reserved.   Supine: Leg Stretch With Strap (Basic)    Lie on back with one knee bent, foot flat on floor. Hook strap around other foot. Straighten knee. Keep knee level with other knee. Hold _30__ seconds. Relax leg completely down to floor.  Repeat _2__ times per session. Do __1_ sessions per day.  Copyright  VHI. All rights reserved.    Lie on your back with both knees bend, feet on the floor. Pick up on leg and bring ankle across the opposite knee, then allow hip to open and feel stretch in butt. You can add additional stretch by placing hands on inside of knee and down and out. Hold 30 sec. 2 times 1 time per day.   Piriformis Stretch, Sitting    Sit, one ankle on opposite knee, same-side hand on crossed knee. Push down on knee, keeping spine straight. Lean torso forward, with flat back, until tension is felt in hamstrings and gluteals of crossed-leg side. Hold 30___ seconds.  Repeat __2_ times per session. Do _1__ sessions per day.  Copyright  VHI. All rights reserved.  [facility] [address], [address] [place],  [place] [address] Phone # [phone] Fax [phone]

## 2016-08-05 ENCOUNTER — Ambulatory Visit: Payer: PRIVATE HEALTH INSURANCE | Admitting: Physical Therapy

## 2016-08-05 ENCOUNTER — Encounter: Payer: Self-pay | Admitting: Physical Therapy

## 2016-08-05 DIAGNOSIS — M5416 Radiculopathy, lumbar region: Secondary | ICD-10-CM

## 2016-08-05 DIAGNOSIS — M6281 Muscle weakness (generalized): Secondary | ICD-10-CM | POA: Diagnosis not present

## 2016-08-05 NOTE — Patient Instructions (Addendum)
Bracing With Arms / Legs (Hook-Lying)    With neutral spine, tighten pelvic floor and abdominals and hold. Raise arm and opposite leg, then return. Repeat wtih other limbs. Repeat __20_ times. Do _1__ times a day. Keep elbows straight  Copyright  VHI. All rights reserved.  Bracing With Knee Fallout (Hook-Lying)    With neutral spine, tighten pelvic floor and abdominals and hold. Alternating legs, drop knee out to side. Keep opposite hip still. Repeat _20__ times. Do _1__ times a day. Do not let pelvis move.  Copyright  VHI. All rights reserved.    Bracing With Heel Slides (Supine)    With neutral spine, tighten pelvic floor and abdominals and hold. Alternating legs, slide heel to bottom. Repeat _20__ times. Do _1__ times a day.   Copyright  VHI. All rights reserved.  Bracing With Bridging (Hook-Lying)    With neutral spine, tighten pelvic floor and abdominals and hold. Lift bottom. Repeat _20__ times. Do _1__ times a day.   Copyright  VHI. All rights reserved.  Side Pull: Double Arm    On back, knees bent, feet flat. Arms perpendicular to body, shoulder level, elbows straight but relaxed. Pull arms out to sides, elbows straight. Resistance band comes across collarbones, hands toward floor. Hold momentarily. Slowly return to starting position. Repeat _20__ times. Band color _green____    Copyright  VHI. All rights reserved.  Shoulder Rotation: Double Arm    On back, knees bent, feet flat, elbows tucked at sides, bent 90, hands palms up. Pull hands apart and down toward floor, keeping elbows near sides. Hold momentarily. Slowly return to starting position. Repeat _20__ times. Band color _green band_____   Copyright  VHI. All rights reserved.  Sash    On back, knees bent, feet flat, left hand on left hip, right hand above left. Pull right arm DIAGONALLY (hip to shoulder) across chest. Bring right arm along head toward floor. Hold momentarily. Slowly return to  starting position. Repeat _20__ times. Do with left arm. Band color __green____   Copyright  VHI. All rights reserved.  Lower Limb Neural Tension (Sitting)    Feet on floor, hands behind back, slump forward, bending neck. Straighten right knee until stretch is felt. Hold __1__ seconds. Relax. Repeat _5___ times per set. Do _1___ sets per session. Do _2___ sessions per day.  http://orth.exer.us/282   Copyright  VHI. All rights reserved.  Knee Extension (Sitting)    Straighten knee fully, lower slowly. Repeat _30___ times per set. Do __1__ sets per session. Do _1___ sessions per day.  http://orth.exer.us/732   Copyright  VHI. All rights reserved.  [facility] [address] [place], [place] [address] Phone # [phone] Fax [phone]

## 2016-08-05 NOTE — Therapy (Addendum)
[facility] [address], [place], [place], [address] Phone: [phone]   Fax:  [phone]  Physical Therapy Treatment  Patient Details  Name: Edward Stanley MRN: [id number] Date of Birth: July 05, 1980 Referring Provider: Dr. Basil Dess  Encounter Date: 08/05/2016      PT End of Session - 08/05/16 1654    Visit Number 2   Date for PT Re-Evaluation 10/24/16   PT Start Time 1615   PT Stop Time 1658   PT Time Calculation (min) 43 min   Activity Tolerance Patient tolerated treatment well   Behavior During Therapy [facility] for tasks assessed/performed      Past Medical History:  Diagnosis Date  . GERD (gastroesophageal reflux disease)   . HNP (herniated nucleus pulposus)    L5-S1  . HTN (hypertension)     Past Surgical History:  Procedure Laterality Date  . LAPAROSCOPIC APPENDECTOMY N/A 08/19/2014   Procedure: APPENDECTOMY LAPAROSCOPIC;  Surgeon: Donnie Mesa, MD;  Location: [place];  Service: General;  Laterality: N/A;  . LUMBAR LAMINECTOMY N/A 07/16/2016   Procedure: Bilateral L5-S1 MICRODISCECTOMY;  Surgeon: Jessy Oto, MD;  Location: [place];  Service: Orthopedics;  Laterality: N/A;  . None      There were no vitals filed for this visit.      Subjective Assessment - 08/05/16 1620    Subjective My back is feeling better.  I was on my feet for 4-5 hours and I was not sore just tired.    How long can you stand comfortably? standing 1 hour back pain   How long can you walk comfortably? no trouble walking   Patient Stated Goals increased strength of back   Currently in Pain? No/denies                         [facility] Adult PT Treatment/Exercise - 08/05/16 0001      Lumbar Exercises: Aerobic   Stationary Bike level 3 5 min   UBE (Upper Arm Bike) level 0 2 min forward/2 min backward                PT Education - 08/05/16 1653    Education provided Yes   Education Details scapular  strengthening; abdominal strength; neural tension stretch   Person(s) Educated Patient   Methods Explanation;Demonstration;Verbal cues;Handout   Comprehension Returned demonstration;Verbalized understanding          PT Short Term Goals - 08/05/16 1700      PT SHORT TERM GOAL #1   Title independent with initial HEP with hamstring stretch, core stabilization   Time 4   Period Weeks   Status On-going     PT SHORT TERM GOAL #2   Title understand correct body mechanics with daily tasks to decrease strain on lumbar   Time 4   Period Weeks   Status On-going           PT Long Term Goals - 08/01/16 1514      PT LONG TERM GOAL #1   Title independent with HEP and understand progression   Time 12   Period Weeks   Status New     PT LONG TERM GOAL #2   Title ability to lift at work with correct body mechanics and no increased in pain   Time 12   Period Weeks   Status New     PT LONG TERM GOAL #3   Title walking for 45 minutes  due to increased endurance and strength of core   Time 12   Period Weeks   Status New     PT LONG TERM GOAL #4   Title able to perform work duties including riding in car for 1 hour, lifting, and sitting without increase in pain   Time 12   Period Weeks   Status New     PT LONG TERM GOAL #5   Title start a swimming program due to pool opened and no increased pain and improved core strength   Time 12   Period Weeks   Status New     Additional Long Term Goals   Additional Long Term Goals Yes     PT LONG TERM GOAL #6   Title FOTO score is </= 30% limitation   Time 12   Period Weeks   Status New               Plan - 08/05/16 1655    Clinical Impression Statement Patient was able to exercise without increased pain.  Patient has not met goals due to just starting.  Patient needs scapular cueing to keep shoulders back.  Patient will benefit from skilled therapy to improve endurance, strength and ability to tolerate daily function.     Rehab Potential Excellent   Clinical Impairments Affecting Rehab Potential s/p lumbar lamenectomy 07/16/2016; No lifting greater for 10# till 08/13/2016, No bedning, twisting, and stooping till 08/13/2016. After 4-6 weeks cna resume normal lifting, bending and riding.    PT Frequency 3x / week   PT Duration 12 weeks   PT Treatment/Interventions Cryotherapy;Electrical Stimulation;Moist Heat;Ultrasound;Patient/family education;Neuromuscular re-education;Therapeutic exercise;Therapeutic activities;Manual techniques;Scar mobilization   PT Next Visit Plan core stabilization low level, modalities as needed,, aerobic activity, body mechanics; quadruped lift extremity   PT Home Exercise Plan body mechainics   Consulted and Agree with Plan of Care Patient      Patient will benefit from skilled therapeutic intervention in order to improve the following deficits and impairments:  Decreased strength, Decreased mobility, Decreased scar mobility, Improper body mechanics, Decreased activity tolerance, Decreased endurance, Pain, Increased fascial restricitons  Visit Diagnosis: Muscle weakness (generalized)  Radiculopathy, lumbar region     Problem List Patient Active Problem List   Diagnosis Date Noted  . Herniation of lumbar intervertebral disc 07/16/2016  . Herniated intervertebral disc of lumbar spine 07/16/2016  . Acute appendicitis without peritonitis 08/19/2014    Earlie Counts, PT 08/05/16 5:01 PM   [facility] [address] [place], [place], [address] Phone: [phone]   Fax:  [phone]  Name: Edward Stanley MRN: [id number] Date of Birth: Feb 02, 1981 PHYSICAL THERAPY DISCHARGE SUMMARY  Visits from Start of Care: 2  Current functional level related to goals / functional outcomes: See above   Remaining deficits: See above.  Patient has not come to therapy since last visit on 08/12/2016. No phone call to re-schedule after  her cancelled all of his visits.    Education / Equipment: HEP Plan: Patient agrees to discharge.  Patient goals were not met. Patient is being discharged due to not returning since the last visit. Thank you for the referral. Earlie Counts, PT 09/26/16 12:53 PM   ?????

## 2016-08-07 ENCOUNTER — Encounter: Payer: Self-pay | Admitting: Physical Therapy

## 2016-08-07 ENCOUNTER — Ambulatory Visit: Payer: PRIVATE HEALTH INSURANCE | Admitting: Physical Therapy

## 2016-08-07 DIAGNOSIS — M6281 Muscle weakness (generalized): Secondary | ICD-10-CM

## 2016-08-07 DIAGNOSIS — M5416 Radiculopathy, lumbar region: Secondary | ICD-10-CM

## 2016-08-07 NOTE — Therapy (Signed)
[facility] [address], [address] [place], [place], [address] Phone: [phone]   Fax:  [phone]  Physical Therapy Treatment  Patient Details  Name: Edward Stanley MRN: [id number] Date of Birth: [birth year] Referring Provider: Dr. Vira Browns  Encounter Date: 08/07/2016      PT End of Session - 08/07/16 1526    Visit Number 3   Date for PT Re-Evaluation 10/24/16   PT Start Time 1445   PT Stop Time 1530   PT Time Calculation (min) 45 min   Activity Tolerance Patient tolerated treatment well   Behavior During Therapy [facility] for tasks assessed/performed      Past Medical History:  Diagnosis Date  . GERD (gastroesophageal reflux disease)   . HNP (herniated nucleus pulposus)    L5-S1  . HTN (hypertension)     Past Surgical History:  Procedure Laterality Date  . LAPAROSCOPIC APPENDECTOMY N/A 08/19/2014   Procedure: APPENDECTOMY LAPAROSCOPIC;  Surgeon: Manus Rudd, MD;  Location: [facility];  Service: General;  Laterality: N/A;  . LUMBAR LAMINECTOMY N/A 07/16/2016   Procedure: Bilateral L5-S1 MICRODISCECTOMY;  Surgeon: Kerrin Champagne, MD;  Location: [facility];  Service: Orthopedics;  Laterality: N/A;  . None      There were no vitals filed for this visit.      Subjective Assessment - 08/07/16 1456    Subjective I have some questions about the exercises.  No increase in pain after therapy.    How long can you stand comfortably? standing 1 hour back pain   How long can you walk comfortably? no trouble walking   Patient Stated Goals increased strength of back   Currently in Pain? No/denies                         [facility] Adult PT Treatment/Exercise - 08/07/16 0001      Lumbar Exercises: Aerobic   Stationary Bike level 5 for 10 min                PT Education - 08/07/16 1525    Education provided Yes   Education Details tricep , bicep, back stabilization,    Person(s) Educated Patient   Methods  Explanation;Demonstration;Verbal cues;Handout   Comprehension Returned demonstration;Verbalized understanding          PT Short Term Goals - 08/05/16 1700      PT SHORT TERM GOAL #1   Title independent with initial HEP with hamstring stretch, core stabilization   Time 4   Period Weeks   Status On-going     PT SHORT TERM GOAL #2   Title understand correct body mechanics with daily tasks to decrease strain on lumbar   Time 4   Period Weeks   Status On-going           PT Long Term Goals - 08/01/16 1514      PT LONG TERM GOAL #1   Title independent with HEP and understand progression   Time 12   Period Weeks   Status New     PT LONG TERM GOAL #2   Title ability to lift at work with correct body mechanics and no increased in pain   Time 12   Period Weeks   Status New     PT LONG TERM GOAL #3   Title walking for 45 minutes due to increased endurance and strength of core   Time 12   Period Weeks   Status New  PT LONG TERM GOAL #4   Title able to perform work duties including riding in car for 1 hour, lifting, and sitting without increase in pain   Time 12   Period Weeks   Status New     PT LONG TERM GOAL #5   Title start a swimming program due to pool opened and no increased pain and improved core strength   Time 12   Period Weeks   Status New     Additional Long Term Goals   Additional Long Term Goals Yes     PT LONG TERM GOAL #6   Title FOTO score is </= 30% limitation   Time 12   Period Weeks   Status New               Plan - 08/07/16 1527    Clinical Impression Statement Patient is able to exercise without increased in pain.  Patient is able to do the bike and UBE for longer period of time to increase endurance. Patient has a 30 minute workout for home Patient will benefit from skilled therapy to improve endurance, strength and ability to tolerate daily function.    Clinical Impairments Affecting Rehab Potential s/p lumbar lamenectomy  07/16/2016; No lifting greater for 10# till 08/13/2016, No bedning, twisting, and stooping till 08/13/2016. After 4-6 weeks cna resume normal lifting, bending and riding.    PT Frequency 3x / week   PT Duration 12 weeks   PT Treatment/Interventions Cryotherapy;Electrical Stimulation;Moist Heat;Ultrasound;Patient/family education;Neuromuscular re-education;Therapeutic exercise;Therapeutic activities;Manual techniques;Scar mobilization   PT Next Visit Plan core stabilization  modalities as needed,, aerobic activity, body mechanics; quadruped lift extremity   PT Home Exercise Plan progress as needed   Consulted and Agree with Plan of Care Patient      Patient will benefit from skilled therapeutic intervention in order to improve the following deficits and impairments:  Decreased strength, Decreased mobility, Decreased scar mobility, Improper body mechanics, Decreased activity tolerance, Decreased endurance, Pain, Increased fascial restricitons  Visit Diagnosis: Muscle weakness (generalized)  Radiculopathy, lumbar region     Problem List Patient Active Problem List   Diagnosis Date Noted  . Herniation of lumbar intervertebral disc 07/16/2016  . Herniated intervertebral disc of lumbar spine 07/16/2016  . Acute appendicitis without peritonitis 08/19/2014    Eulis Foster, PT 08/07/16 3:31 PM   [facility] [address] [place], [place], [address] Phone: [phone]   Fax:  [phone]  Name: Edward Stanley MRN: [id number] Date of Birth: 1980-07-03

## 2016-08-07 NOTE — Patient Instructions (Addendum)
FLEXION: Standing - Wrist Weight / Dumbbell: Stable (Power)    Stand, right arm at side. Bend elbow, bringing hand toward shoulder as quickly as possible. Use _2__ lbs. Complete __3_ sets of _10__ repetitions. Perform _1__ sessions per day.  Copyright  VHI. All rights reserved.  Arm Curl    Sit or stand with feet shoulder width apart, arms straight down at sides, palms forward. Inhale, then exhale while slowly curling weights toward shoulders and keeping elbows touching torso. Slowly return to starting position. Repeat __10__ times per set. Do _3___ sets per session. Do _1___ sessions per week. May be done with dumbbells, tubing or resistive band.  Copyright  VHI. All rights reserved.  Leisurely Elbow Weight    Lie comfortably on back with a weight in one palm. Bend arm so elbow points up and weight gently hangs. Keep shoulder flat on floor. Raise hand to straighten arm. Repeat with other arm. Repeat __10__ times. Do _3___ sessions per day.  http://[email or web address].exer.us/187   Copyright  VHI. All rights reserved.    Start on your back with your knees bent and a _4__ pound weight in your hands.  Engage your abdominals by pulling your belly button toward the table and raising your arms overhead as shown. 10x2  Hold weights in midline of trunk and move weights up/down, side to side.  Arm / Leg Lift: Opposite (Prone)    Lift right leg and opposite arm __2__ inches from floor, keeping knee locked. Repeat __10__ times per set. Do _2___ sets per session. Do __1__ sessions per day.  http://orth.exer.us/100   Copyright  VHI. All rights reserved.  [facility] [address] [place], [place] [address] Phone # [phone] Fax [phone]

## 2016-08-12 ENCOUNTER — Ambulatory Visit: Payer: PRIVATE HEALTH INSURANCE | Admitting: Physical Therapy

## 2016-08-12 ENCOUNTER — Encounter: Payer: Self-pay | Admitting: Physical Therapy

## 2016-08-12 DIAGNOSIS — M5416 Radiculopathy, lumbar region: Secondary | ICD-10-CM

## 2016-08-12 DIAGNOSIS — M6281 Muscle weakness (generalized): Secondary | ICD-10-CM

## 2016-08-12 NOTE — Therapy (Signed)
[facility] [address], [address] [place], [place], [address] Phone: [phone]   Fax:  [phone]  Physical Therapy Treatment  Patient Details  Name: Edward Stanley MRN: [id number] Date of Birth: March 08, 1981 Referring Provider: Dr. Vira Browns  Encounter Date: 08/12/2016      PT End of Session - 08/12/16 1628    Visit Number 4   Date for PT Re-Evaluation 10/24/16   PT Start Time 1530   PT Stop Time 1610   PT Time Calculation (min) 40 min   Activity Tolerance Patient tolerated treatment well   Behavior During Therapy [facility] for tasks assessed/performed      Past Medical History:  Diagnosis Date  . GERD (gastroesophageal reflux disease)   . HNP (herniated nucleus pulposus)    L5-S1  . HTN (hypertension)     Past Surgical History:  Procedure Laterality Date  . LAPAROSCOPIC APPENDECTOMY N/A 08/19/2014   Procedure: APPENDECTOMY LAPAROSCOPIC;  Surgeon: Manus Rudd, MD;  Location: [facility] OR;  Service: General;  Laterality: N/A;  . LUMBAR LAMINECTOMY N/A 07/16/2016   Procedure: Bilateral L5-S1 MICRODISCECTOMY;  Surgeon: Kerrin Champagne, MD;  Location: [facility] OR;  Service: Orthopedics;  Laterality: N/A;  . None      There were no vitals filed for this visit.      Subjective Assessment - 08/12/16 1535    Subjective I still have tightness in right hamstring.  I have no pain.    How long can you stand comfortably? standing 1 hour back pain   How long can you walk comfortably? no trouble walking   Patient Stated Goals increased strength of back   Currently in Pain? No/denies   Multiple Pain Sites No                         [facility] Adult PT Treatment/Exercise - 08/12/16 0001      Exercises   Exercises Other Exercises   Other Exercises  seated interscapular exercises with green band to strengthen back muscles; discussed with patient on cardio equipment to use at home and begin at 5 min. every other day then start to  increase if no increase in pain, exercise on physioball at home and what exercises to do     Lumbar Exercises: Stretches   Passive Hamstring Stretch 2 reps;30 seconds   Passive Hamstring Stretch Limitations supine     Lumbar Exercises: Aerobic   Stationary Bike random level 2 10 min   UBE (Upper Arm Bike) level 2 4 min forward and 4 min backward     Lumbar Exercises: Standing   Other Standing Lumbar Exercises tall knee to 1/2 kneel   with abdominal contraction 15 times     Lumbar Exercises: Supine   Bent Knee Raise 20 reps  alternate toes 3 sets   Straight Leg Raise 20 reps  not letting hele touch the mat bil.      Manual Therapy   Manual Therapy Neural Stretch;Soft tissue mobilization   Soft tissue mobilization using prickly ball roller to bil. hamstring so patient can do at home   Neural Stretch of right LE for sciatica, peroneal nerve, dorsal tibial nerve                PT Education - 08/12/16 1627    Education provided Yes   Education Details using blue theraband to progress; interscapular exercises on physioball instead of supine; using cardio equipment at home   Person(s) Educated  Patient   Methods Explanation;Demonstration;Verbal cues;Handout   Comprehension Returned demonstration;Verbalized understanding          PT Short Term Goals - 08/12/16 1629      PT SHORT TERM GOAL #1   Title independent with initial HEP with hamstring stretch, core stabilization   Time 4   Period Weeks   Status Achieved     PT SHORT TERM GOAL #2   Title understand correct body mechanics with daily tasks to decrease strain on lumbar   Time 4   Period Weeks   Status Achieved           PT Long Term Goals - 08/01/16 1514      PT LONG TERM GOAL #1   Title independent with HEP and understand progression   Time 12   Period Weeks   Status New     PT LONG TERM GOAL #2   Title ability to lift at work with correct body mechanics and no increased in pain   Time 12    Period Weeks   Status New     PT LONG TERM GOAL #3   Title walking for 45 minutes due to increased endurance and strength of core   Time 12   Period Weeks   Status New     PT LONG TERM GOAL #4   Title able to perform work duties including riding in car for 1 hour, lifting, and sitting without increase in pain   Time 12   Period Weeks   Status New     PT LONG TERM GOAL #5   Title start a swimming program due to pool opened and no increased pain and improved core strength   Time 12   Period Weeks   Status New     Additional Long Term Goals   Additional Long Term Goals Yes     PT LONG TERM GOAL #6   Title FOTO score is </= 30% limitation   Time 12   Period Weeks   Status New               Plan - 08/12/16 1628    Clinical Impression Statement Patient has no pain with exercise.  Patient has tightness in hamstring and understands how to use a roller to relax the muscle.  Patient will benefit from skilled therapy to improve endurance, strength and ability to tolerate daily function.    Rehab Potential Excellent   Clinical Impairments Affecting Rehab Potential s/p lumbar lamenectomy 07/16/2016; No lifting greater for 10# till 08/13/2016, No bedning, twisting, and stooping till 08/13/2016. After 4-6 weeks cna resume normal lifting, bending and riding.    PT Frequency 3x / week   PT Duration 12 weeks   PT Treatment/Interventions Cryotherapy;Electrical Stimulation;Moist Heat;Ultrasound;Patient/family education;Neuromuscular re-education;Therapeutic exercise;Therapeutic activities;Manual techniques;Scar mobilization   PT Next Visit Plan core stabilization  see if patient is ready for discharge   PT Home Exercise Plan progress as needed   Consulted and Agree with Plan of Care Patient      Patient will benefit from skilled therapeutic intervention in order to improve the following deficits and impairments:  Decreased strength, Decreased mobility, Decreased scar mobility, Improper  body mechanics, Decreased activity tolerance, Decreased endurance, Pain, Increased fascial restricitons  Visit Diagnosis: Muscle weakness (generalized)  Radiculopathy, lumbar region     Problem List Patient Active Problem List   Diagnosis Date Noted  . Herniation of lumbar intervertebral disc 07/16/2016  . Herniated intervertebral disc of lumbar spine 07/16/2016  .  Acute appendicitis without peritonitis 08/19/2014    Eulis Foster, PT 08/12/16 4:31 PM   [facility] [address] [place], [place], [address] Phone: [phone]   Fax:  [phone]  Name: Edward Stanley MRN: [id number] Date of Birth: 1980-11-20

## 2016-08-14 ENCOUNTER — Encounter: Payer: PRIVATE HEALTH INSURANCE | Admitting: Physical Therapy

## 2016-08-19 ENCOUNTER — Encounter: Payer: PRIVATE HEALTH INSURANCE | Admitting: Physical Therapy

## 2016-08-21 ENCOUNTER — Encounter: Payer: PRIVATE HEALTH INSURANCE | Admitting: Physical Therapy

## 2016-09-11 ENCOUNTER — Ambulatory Visit (INDEPENDENT_AMBULATORY_CARE_PROVIDER_SITE_OTHER): Payer: PRIVATE HEALTH INSURANCE | Admitting: Cardiovascular Disease

## 2016-09-11 ENCOUNTER — Encounter: Payer: Self-pay | Admitting: Cardiovascular Disease

## 2016-09-11 VITALS — BP 110/60 | HR 94 | Ht 76.0 in | Wt 222.8 lb

## 2016-09-11 DIAGNOSIS — R072 Precordial pain: Secondary | ICD-10-CM

## 2016-09-11 DIAGNOSIS — I10 Essential (primary) hypertension: Secondary | ICD-10-CM | POA: Diagnosis not present

## 2016-09-11 NOTE — Patient Instructions (Addendum)
Medication Instructions:  Your physician recommends that you continue on your current medications as directed. Please refer to the Current Medication list given to you today.   Labwork: Lab work to be done today--BMP  Testing/Procedures: Your physician has requested that you have cardiac CT. Cardiac computed tomography (CT) is a painless test that uses an x-ray machine to take clear, detailed pictures of your heart. For further information please visit [email or web address]. Please follow instruction sheet as given.    Follow-Up: To be determined based on test results.     Any Other Special Instructions Will Be Listed Below (If Applicable).     If you need a refill on your cardiac medications before your next appointment, please call your pharmacy.

## 2016-09-11 NOTE — Progress Notes (Signed)
History of Present Illness: 36 yo male with history of HTN, GERD, and HLD here today for cardiac follow up. I saw him as a new patient for evaluation of chest pain in August 2015. Given his family history of CAD, I arranged an echo and a stress test. Echo 2015 with normal LV function and no valve disease. Treadmill stress test 2015 with no evidence of ischemia after 12 minutes of exercise. Chest pain resolved with treatment of GERD with PPI.   He is here today for follow up. He has had resting chest pain. This feels like a burning. No exertional chest pain. No dyspnea. He has rare palpitations. No lower extremity edema, orthopnea, PND, dizziness, near syncope or syncope.   Primary Care Physician: Eartha Inch, MD  Past Medical History:  Diagnosis Date  . GERD (gastroesophageal reflux disease)   . HNP (herniated nucleus pulposus)    L5-S1  . HTN (hypertension)     Past Surgical History:  Procedure Laterality Date  . LAPAROSCOPIC APPENDECTOMY N/A 08/19/2014   Procedure: APPENDECTOMY LAPAROSCOPIC;  Surgeon: Manus Rudd, MD;  Location: [facility] OR;  Service: General;  Laterality: N/A;  . LUMBAR LAMINECTOMY N/A 07/16/2016   Procedure: Bilateral L5-S1 MICRODISCECTOMY;  Surgeon: Kerrin Champagne, MD;  Location: [facility] OR;  Service: Orthopedics;  Laterality: N/A;  . None      Current Outpatient Prescriptions  Medication Sig Dispense Refill  . CARAFATE 1 GM/10ML suspension Take 10 mLs by mouth 4 (four) times daily as needed for heartburn.  1  . DEXILANT 60 MG capsule Take 60 mg by mouth daily.   3  . lisinopril-hydrochlorothiazide (PRINZIDE,ZESTORETIC) 20-12.5 MG tablet TAKE 2 TABLET BY MOUTH DAILY.    . Probiotic Product (PROBIOTIC DAILY) CAPS Take 1 capsule by mouth daily.     No current facility-administered medications for this visit.     No Known Allergies  Social History   Social History  . Marital status: Married    Spouse name: N/A  . Number of children: 1  . Years of  education: N/A   Occupational History  . Sales    Social History Main Topics  . Smoking status: Never Smoker  . Smokeless tobacco: Never Used  . Alcohol use 4.2 oz/week    7 Cans of beer per week     Comment: daily  . Drug use: No  . Sexual activity: Not on file   Other Topics Concern  . Not on file   Social History Narrative  . No narrative on file    Family History  Problem Relation Age of Onset  . Heart disease Father   . Heart attack Paternal Grandfather   . Prostate cancer Paternal Grandfather   . Breast cancer Paternal Grandmother     Review of Systems:  As stated in the HPI and otherwise negative.   BP 110/60   Pulse 94   Ht 6\' 4"  (1.93 m)   Wt 222 lb 12.8 oz (101.1 kg)   SpO2 98%   BMI 27.12 kg/m   Physical Examination:  General: Well developed, well nourished, NAD  HEENT: OP clear, mucus membranes moist  SKIN: warm, dry. No rashes. Neuro: No focal deficits  Musculoskeletal: Muscle strength 5/5 all ext  Psychiatric: Mood and affect normal  Neck: No JVD, no carotid bruits, no thyromegaly, no lymphadenopathy.  Lungs:Clear bilaterally, no wheezes, rhonci, crackles Cardiovascular: Regular rate and rhythm. No murmurs, gallops or rubs. Abdomen:Soft. Bowel sounds present. Non-tender.  Extremities: No  lower extremity edema. Pulses are 2 + in the bilateral DP/PT.  Echo 12/17/13: Left ventricle: The cavity size was normal. Systolic function was normal. The estimated ejection fraction was in the range of 60% to 65%. Wall motion was normal; there were no regional wall motion abnormalities. - Pulmonary arteries: PA peak pressure: 35 mm Hg (S).  EKG:  EKG is not ordered today. The ekg ordered today demonstrates   Recent Labs: 07/16/2016: BUN 17; Creatinine, Ser 0.79; Hemoglobin 13.9; Platelets 191; Potassium 4.3; Sodium 139   Lipid Panel No results found for: CHOL, TRIG, HDL, CHOLHDL, VLDL, LDLCALC, LDLDIRECT   Wt Readings from Last 3 Encounters:    09/11/16 222 lb 12.8 oz (101.1 kg)  08/01/16 225 lb (102.1 kg)  07/15/16 225 lb (102.1 kg)     Other studies Reviewed: Additional studies/ records that were reviewed today include: . Review of the above records demonstrates:   Assessment and Plan:   1. Chest pain: He is having episodes of chest pain with rest. No clear exertional component. He is on treatment for GERD for still having breakthrough pain. He has a strong family history of CAD with premature CAD in his father in his 3840s. He has a personal history of HTN. Will arrange a Cardiac CTA to assess for CAD.   2. HTN: BP controlled. No changes.   Current medicines are reviewed at length with the patient today.  The patient does not have concerns regarding medicines.  The following changes have been made:  no change  Labs/ tests ordered today include:   Orders Placed This Encounter  Procedures  . CT CORONARY MORPH W/CTA COR W/SCORE W/CA W/CM &/OR WO/CM  . CT CORONARY FRACTIONAL FLOW RESERVE DATA PREP  . CT CORONARY FRACTIONAL FLOW RESERVE FLUID ANALYSIS  . Basic Metabolic Panel (BMET)     Disposition:   FU with me in 12 months   Signed, Verne Carrowhristopher Kynzlee Hucker, MD 09/11/2016 4:04 PM    [facility] [address], [place], [place]  [address] Phone: [phone]; Fax: [phone]

## 2016-09-12 ENCOUNTER — Telehealth: Payer: Self-pay | Admitting: Cardiology

## 2016-09-12 LAB — BASIC METABOLIC PANEL
BUN / CREAT RATIO: 24 — AB (ref 9–20)
BUN: 21 mg/dL — ABNORMAL HIGH (ref 6–20)
CALCIUM: 9.8 mg/dL (ref 8.7–10.2)
CO2: 27 mmol/L (ref 18–29)
Chloride: 100 mmol/L (ref 96–106)
Creatinine, Ser: 0.86 mg/dL (ref 0.76–1.27)
GFR calc Af Amer: 127 mL/min/{1.73_m2} (ref >59)
GFR calc non Af Amer: 110 mL/min/{1.73_m2} (ref >59)
GLUCOSE: 76 mg/dL (ref 65–99)
Potassium: 3.9 mmol/L (ref 3.5–5.2)
Sodium: 140 mmol/L (ref 134–144)

## 2016-09-12 NOTE — Telephone Encounter (Signed)
Jasmine DecemberSharon, you can go ahead and schedule it. Thank ou, KN

## 2016-09-12 NOTE — Telephone Encounter (Signed)
Will send this information and clearance for ct scheduling to Omar PersonSharon Ferguson, for further follow-up.

## 2016-09-12 NOTE — Telephone Encounter (Signed)
Dr. Delton SeeNelson,  09-11-16 Dr. Clifton Stanley has ordered Cardiac Ct for Chest pain/HTN   See office note 09/11/16  1. Chest pain: He is having episodes of chest pain with rest. No clear exertional component. He is on treatment for GERD for still having breakthrough pain. He has a strong family history of CAD with premature CAD in his father in his 5540s. He has a personal history of HTN. Will arrange a Cardiac CTA to assess for CAD.   2. HTN: BP controlled. No changes.    Please review and advise.

## 2016-09-20 ENCOUNTER — Encounter: Payer: Self-pay | Admitting: Cardiovascular Disease

## 2016-09-20 NOTE — Addendum Note (Signed)
Addendum  created 09/20/16 1259 by Amadeo Coke, MD   Sign clinical note    

## 2016-10-01 ENCOUNTER — Ambulatory Visit (HOSPITAL_COMMUNITY): Payer: PRIVATE HEALTH INSURANCE

## 2017-02-27 ENCOUNTER — Ambulatory Visit (INDEPENDENT_AMBULATORY_CARE_PROVIDER_SITE_OTHER): Payer: 59 | Admitting: Surgery

## 2017-02-27 ENCOUNTER — Ambulatory Visit (INDEPENDENT_AMBULATORY_CARE_PROVIDER_SITE_OTHER): Payer: Self-pay

## 2017-02-27 ENCOUNTER — Encounter (INDEPENDENT_AMBULATORY_CARE_PROVIDER_SITE_OTHER): Payer: Self-pay | Admitting: Surgery

## 2017-02-27 VITALS — BP 117/78 | HR 63 | Ht 76.0 in | Wt 225.0 lb

## 2017-02-27 DIAGNOSIS — M47816 Spondylosis without myelopathy or radiculopathy, lumbar region: Secondary | ICD-10-CM | POA: Diagnosis not present

## 2017-02-27 DIAGNOSIS — M545 Low back pain, unspecified: Secondary | ICD-10-CM

## 2017-02-27 DIAGNOSIS — Z9889 Other specified postprocedural states: Secondary | ICD-10-CM

## 2017-02-27 MED ORDER — TRAMADOL HCL 50 MG PO TABS: 50.0000 mg | ORAL_TABLET | Freq: Three times a day (TID) | ORAL | 0 refills | Status: DC | PRN

## 2017-02-27 MED ORDER — METHOCARBAMOL 500 MG PO TABS: 500.0000 mg | ORAL_TABLET | Freq: Three times a day (TID) | ORAL | 0 refills | Status: DC | PRN

## 2017-02-27 MED ORDER — METHYLPREDNISOLONE 4 MG PO TABS: ORAL_TABLET | ORAL | 0 refills | Status: DC

## 2017-03-20 ENCOUNTER — Ambulatory Visit (INDEPENDENT_AMBULATORY_CARE_PROVIDER_SITE_OTHER): Payer: 59 | Admitting: Surgery

## 2017-04-12 IMAGING — DX DG LUMBAR SPINE 2-3V
2 series · 2 of 2 positions shown · non-contrast
Comparison: Lumbar MRI 07/05/2016. Outside Lumbar radiographs
07/01/2016

CLINICAL DATA: 36-year-old male undergoing lumbar surgery.

EXAM:
LUMBAR SPINE - 2-3 VIEW

[l-spine lat (1 of 2)]
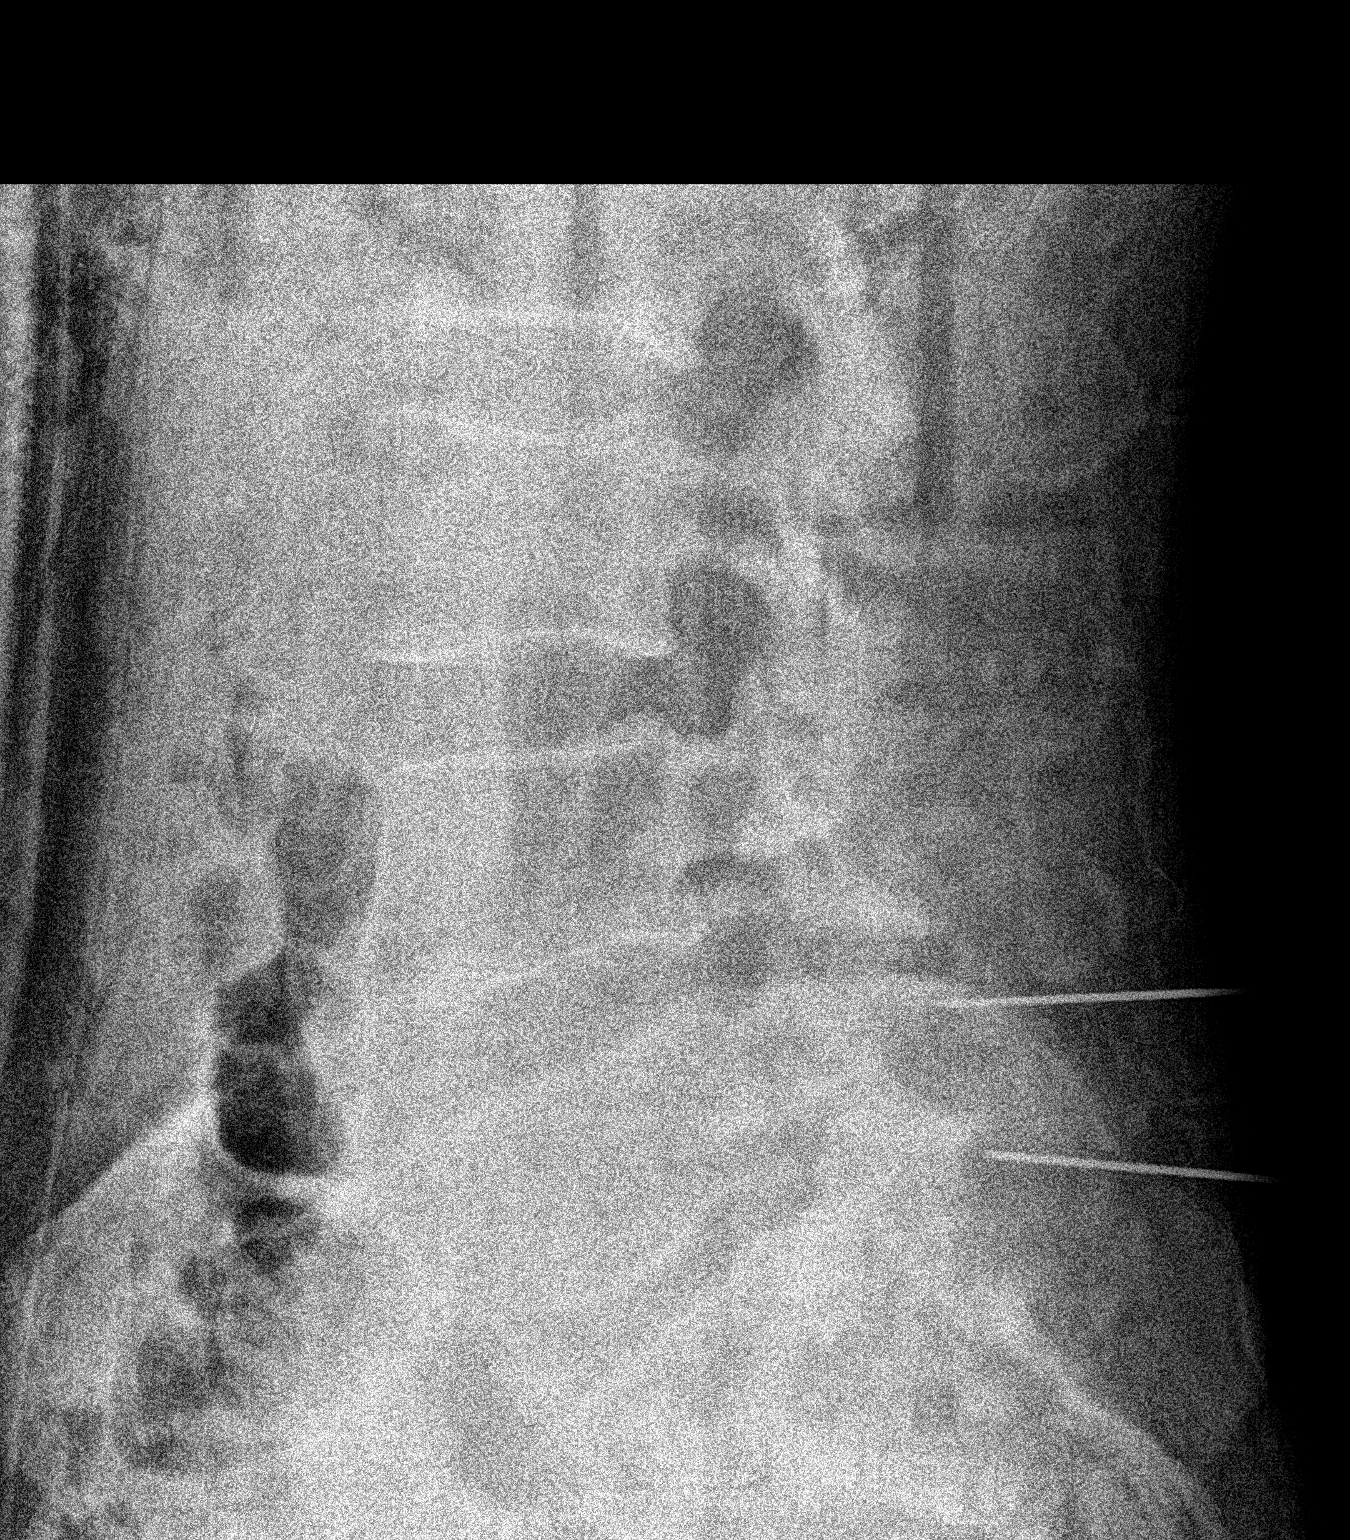

[l-spine lat (2 of 2)]
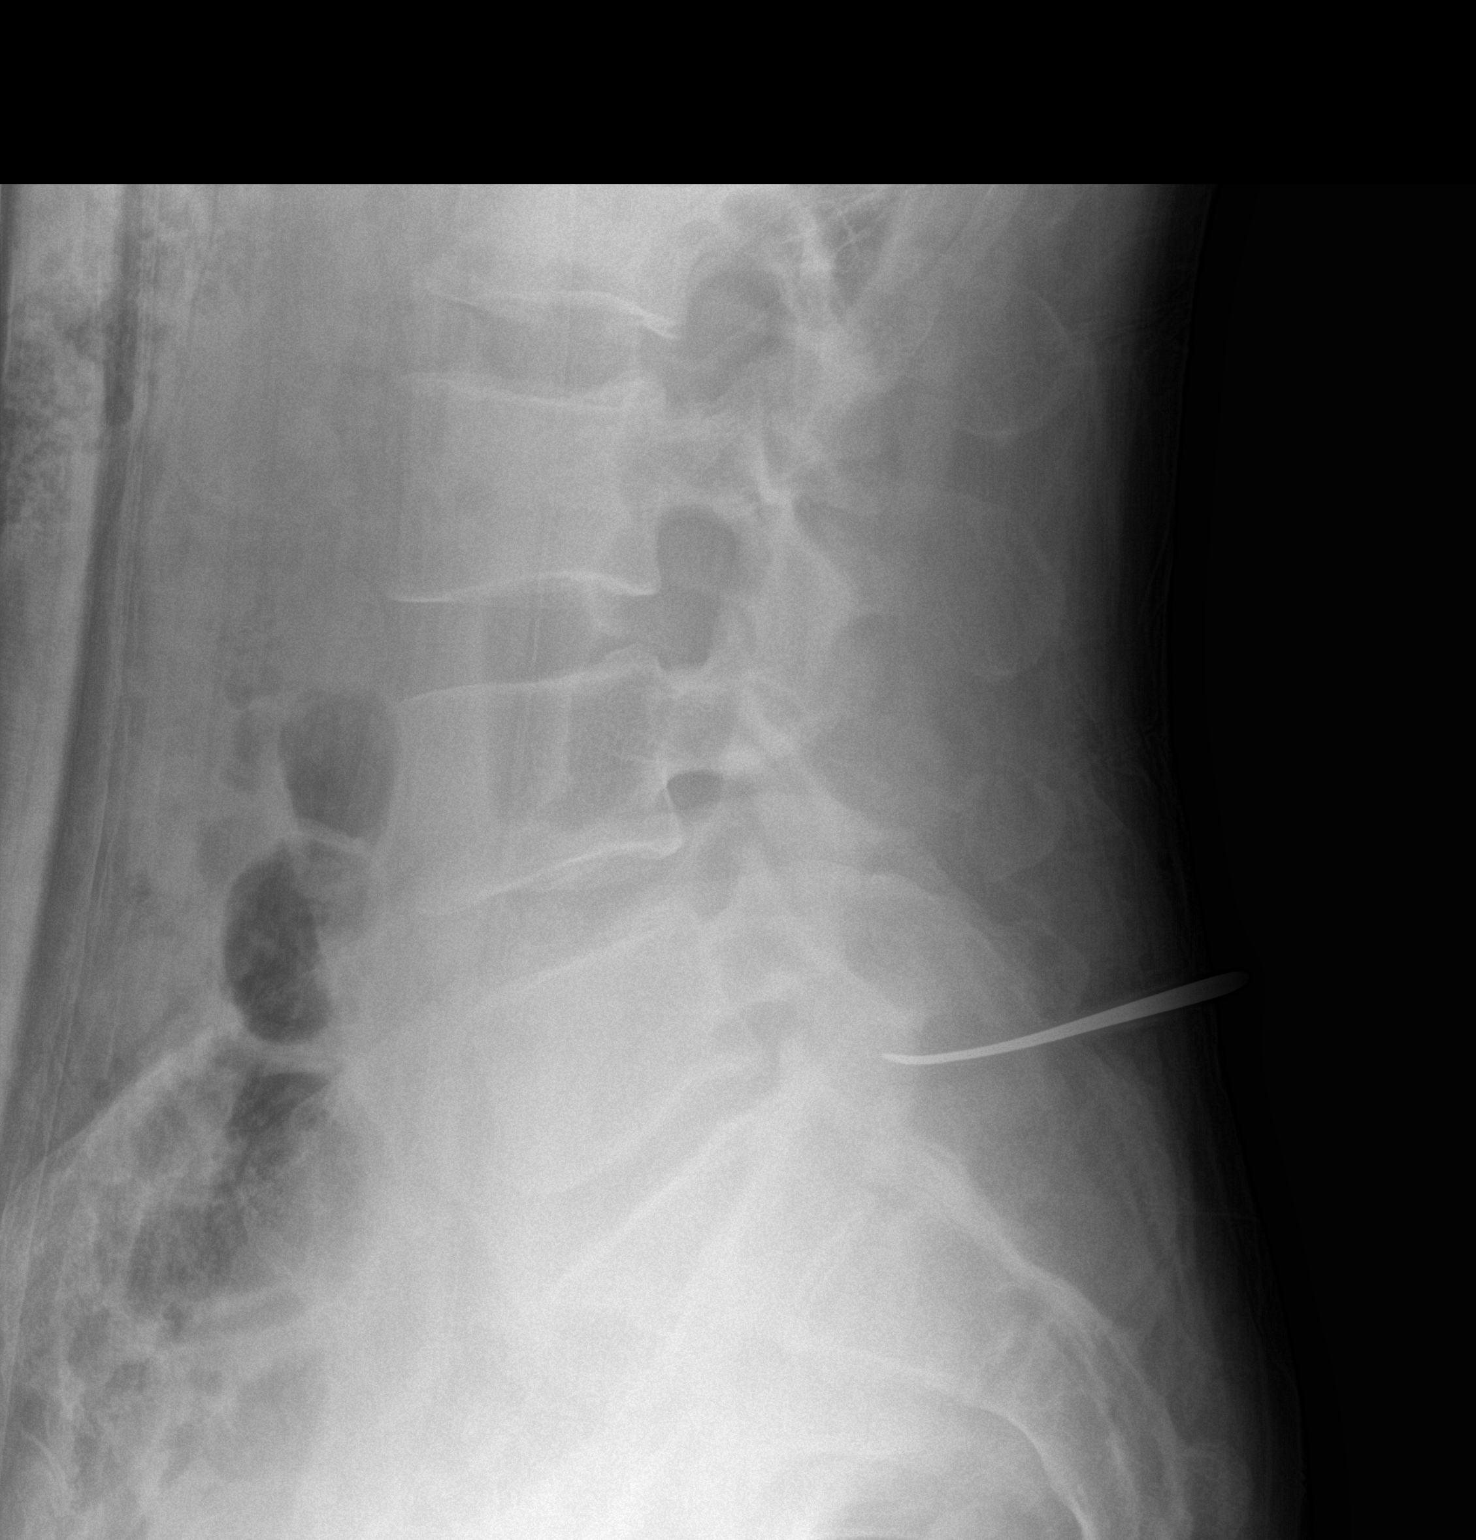

[2 of 2 positions shown; findings below may reference images not displayed]

FINDINGS: Intraoperative portable cross-table lateral views of the lumbar
spine.

Normal lumbar segmentation demonstrated on the prior radiographs and
that is the same numbering system used on the 07/05/2016 MRI.

Image labeled #1 at 5116 hours. Posterior needles directed at the
L4-L5 and L5-S1 disc spaces.

Film labeled #2 at 5116 hours. Surgical probe directed toward the
L5-S1 disc space.
IMPRESSION: Intraoperative localization as above.

## 2017-04-18 NOTE — Progress Notes (Signed)
Office Visit Note   Patient: Edward CumminsBradley Sopher           Date of Birth: 07/13/1980           MRN: [id number] Visit Date: 02/27/2017              Requested by: Edward Stanley, Michael C, MD [address] [place], [place] [address] PCP: Edward Stanley, Michael C, MD   Assessment & Plan: Visit Diagnoses:  1. Status post lumbar laminectomy   2. Spondylosis without myelopathy or radiculopathy, lumbar region   3. Acute midline low back pain without sciatica     Plan: We'll attempt conservative treatment. Patient given prescriptions for Medrol Dosepak 6 day taper to be taken as directed, Robaxin for spasms and Ultram for pain. Follow-up in a few weeks for recheck. He continues to have ongoing symptoms we'll plan to schedule MRI lumbar spine.  Follow-Up Instructions: Return in about 3 weeks (around 03/20/2017) for Fayrene FearingJames to recheck back.   Orders:  Orders Placed This Encounter  Procedures  . XR Lumbar Spine 2-3 Views   Meds ordered this encounter  Medications  . traMADol (ULTRAM) 50 MG tablet    Sig: Take 1 tablet (50 mg total) every 8 (eight) hours as needed by mouth.    Dispense:  50 tablet    Refill:  0  . methocarbamol (ROBAXIN) 500 MG tablet    Sig: Take 1 tablet (500 mg total) every 8 (eight) hours as needed by mouth for muscle spasms.    Dispense:  40 tablet    Refill:  0  . methylPREDNISolone (MEDROL) 4 MG tablet    Sig: 6 day taper to be taken as directed    Dispense:  21 tablet    Refill:  0      Procedures: No procedures performed   Clinical Data: No additional findings.   Subjective: Chief Complaint  Patient presents with  . Lower Back - Pain, Follow-up    HPI Patient comes in today with complaints of low back pain with some radiation to the right knee. Status post L5-S1 microdiscectomy by Dr. Otelia Sergeantnitka 07/16/2016. He had been doing well up until a couple weeks ago. No injury. 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 No points of bowel or bladder comments. No leg weakness. Review  of Systems   Objective: Vital Signs: BP 117/78 (BP Location: Left Arm, Patient Position: Sitting)   Pulse 63   Ht 6\' 4"  (1.93 m)   Wt 225 lb (102.1 kg)   BMI 27.39 kg/m   Physical Exam  Constitutional: He is oriented to person, place, and time. He appears well-developed. No distress.  HENT:  Head: Normocephalic and atraumatic.  Eyes: EOM are normal. Pupils are equal, round, and reactive to light.  Pulmonary/Chest: No respiratory distress.  Musculoskeletal:  Gait is normal. Decreased lumbar flexion and extension due to discomfort. Right-sided lumbar paraspinal tenderness. Right straight leg raise causes pain in the low back. Sciatic notch nontender. Neurovascularly intact. No focal motor deficits.  Neurological: He is alert and oriented to person, place, and time.  Psychiatric: He has a normal mood and affect.    Ortho Exam  Specialty Comments:  No specialty comments available.  Imaging: No results found.   PMFS History: Patient Active Problem List   Diagnosis Date Noted  . Herniation of lumbar intervertebral disc 07/16/2016  . Herniated intervertebral disc of lumbar spine 07/16/2016  . Acute appendicitis without peritonitis 08/19/2014   Past Medical History:  Diagnosis  Date  . GERD (gastroesophageal reflux disease)   . HNP (herniated nucleus pulposus)    L5-S1  . HTN (hypertension)     Family History  Problem Relation Age of Onset  . Heart disease Father   . Heart attack Paternal Grandfather   . Prostate cancer Paternal Grandfather   . Breast cancer Paternal Grandmother     Past Surgical History:  Procedure Laterality Date  . LAPAROSCOPIC APPENDECTOMY N/A 08/19/2014   Procedure: APPENDECTOMY LAPAROSCOPIC;  Surgeon: Manus RuddMatthew Tsuei, MD;  [facility];  Service: General;  Laterality: N/A;  . LUMBAR LAMINECTOMY N/A 07/16/2016   Procedure: Bilateral L5-S1 MICRODISCECTOMY;  Surgeon: Kerrin ChampagneJames E Nitka, MD;  [facility];  Service: Orthopedics;  Laterality: N/A;  .  None     Social History   Occupational History  . Occupation: Sales  Tobacco Use  . Smoking status: Never Smoker  . Smokeless tobacco: Never Used  Substance and Sexual Activity  . Alcohol use: Yes    Alcohol/week: 4.2 oz    Types: 7 Cans of beer per week    Comment: daily  . Drug use: No  . Sexual activity: Not on file

## 2019-08-16 ENCOUNTER — Encounter: Payer: Self-pay | Admitting: Cardiovascular Disease

## 2019-08-19 ENCOUNTER — Ambulatory Visit: Payer: PRIVATE HEALTH INSURANCE | Admitting: Surgery

## 2021-10-29 ENCOUNTER — Emergency Department (HOSPITAL_BASED_OUTPATIENT_CLINIC_OR_DEPARTMENT_OTHER)
Admission: EM | Admit: 2021-10-29 | Discharge: 2021-10-29 | Disposition: A | Discharge status: Home / Self Care | Payer: 59 | Attending: Emergency Medicine | Admitting: Emergency Medicine

## 2021-10-29 DIAGNOSIS — I10 Essential (primary) hypertension: Secondary | ICD-10-CM | POA: Insufficient documentation

## 2021-10-29 DIAGNOSIS — Z79899 Other long term (current) drug therapy: Secondary | ICD-10-CM | POA: Insufficient documentation

## 2021-10-29 DIAGNOSIS — R030 Elevated blood-pressure reading, without diagnosis of hypertension: Secondary | ICD-10-CM

## 2021-10-29 DIAGNOSIS — R42 Dizziness and giddiness: Secondary | ICD-10-CM | POA: Diagnosis present

## 2021-10-29 LAB — BASIC METABOLIC PANEL
Anion gap: 10 (ref 5–15)
BUN: 13 mg/dL (ref 6–20)
CO2: 25 mmol/L (ref 22–32)
Calcium: 10 mg/dL (ref 8.9–10.3)
Chloride: 99 mmol/L (ref 98–111)
Creatinine, Ser: 0.82 mg/dL (ref 0.61–1.24)
GFR, Estimated: >60 mL/min (ref >60)
Glucose, Bld: 89 mg/dL (ref 70–99)
Potassium: 3.9 mmol/L (ref 3.5–5.1)
Sodium: 134 mmol/L — ABNORMAL LOW (ref 135–145)

## 2021-10-29 LAB — CBC WITH DIFFERENTIAL/PLATELET
Abs Immature Granulocytes: 0.02 10*3/uL (ref 0.00–0.07)
Basophils Absolute: 0 10*3/uL (ref 0.0–0.1)
Basophils Relative: 0 %
Eosinophils Absolute: 0.1 10*3/uL (ref 0.0–0.5)
Eosinophils Relative: 1 %
HCT: 41 % (ref 39.0–52.0)
Hemoglobin: 14.1 g/dL (ref 13.0–17.0)
Immature Granulocytes: 0 %
Lymphocytes Relative: 34 %
Lymphs Abs: 2.4 10*3/uL (ref 0.7–4.0)
MCH: 30.3 pg (ref 26.0–34.0)
MCHC: 34.4 g/dL (ref 30.0–36.0)
MCV: 88 fL (ref 80.0–100.0)
Monocytes Absolute: 0.6 10*3/uL (ref 0.1–1.0)
Monocytes Relative: 8 %
Neutro Abs: 4.1 10*3/uL (ref 1.7–7.7)
Neutrophils Relative %: 57 %
Platelets: 204 10*3/uL (ref 150–400)
RBC: 4.66 MIL/uL (ref 4.22–5.81)
RDW: 12.4 % (ref 11.5–15.5)
WBC: 7.2 10*3/uL (ref 4.0–10.5)
nRBC: 0 % (ref 0.0–0.2)

## 2021-10-29 LAB — TROPONIN I (HIGH SENSITIVITY): Troponin I (High Sensitivity): 2 ng/L (ref <18)

## 2021-10-29 MED ORDER — METOPROLOL TARTRATE 25 MG PO TABS
25.0000 mg | ORAL_TABLET | Freq: Once | ORAL | Status: DC
  Filled 2021-10-29: qty 1

## 2021-10-29 NOTE — Discharge Instructions (Signed)
Follow-up with your primary care doctor as scheduled.  As we discussed, I would recommend checking your blood pressure 1-2 times per day on a consistent basis and keep a symptom journal to get an accurate timeline on what is going on.  Please return to the emergency department for any worsening symptoms.

## 2021-10-29 NOTE — ED Triage Notes (Signed)
POV, sts that over the past week he has been getting lightheaded and that usually happens when BP is high, hx of same, felt okay this morning, worked out and then checked BP throughout day and 160/95 was highest recorded at home, lisinopril and metoprolol at home sts that he has been compliant. A&o x4, amb

## 2021-10-29 NOTE — ED Provider Notes (Signed)
[facility] EMERGENCY DEPT Provider Note   CSN: [id number] Arrival date & time: 10/29/21  1840     History Chief Complaint  Patient presents with   Hypertension    Edward Stanley is a 41 y.o. male patient with history of hypertension on lisinopril and metoprolol who presents to the emergency department with elevated blood pressures over the last week.  Patient states he has been having elevated blood pressures at home.  He is states that he has been having intermittent lightheadedness with these episodes which is a good indication that his blood pressure is up.  Patient has been lightheaded all day.  Worked out this morning and took his lisinopril as prescribed.  He has not missed any doses of his medications.  He takes metoprolol in the evening.  Denies any chest pain, shortness of breath, focal weakness or numbness.  Patient states that he has also been having some epigastric burning but does have a history of GERD and this feels similar.   Hypertension       Home Medications Prior to Admission medications   Medication Sig Start Date End Date Taking? Authorizing Provider  CARAFATE 1 GM/10ML suspension Take 10 mLs by mouth 4 (four) times daily as needed for heartburn. 09/09/16   [provider]  DEXILANT 60 MG capsule Take 60 mg by mouth daily.  02/10/14   [provider]  lisinopril-hydrochlorothiazide (PRINZIDE,ZESTORETIC) 20-12.5 MG tablet TAKE 2 TABLET BY MOUTH DAILY. 02/05/16   [provider]  methocarbamol (ROBAXIN) 500 MG tablet Take 1 tablet (500 mg total) every 8 (eight) hours as needed by mouth for muscle spasms. 02/27/17   Naida Sleight, PA-C  methylPREDNISolone (MEDROL) 4 MG tablet 6 day taper to be taken as directed 02/27/17   Naida Sleight, PA-C  Probiotic Product (PROBIOTIC DAILY) CAPS Take 1 capsule by mouth daily.    [provider]  traMADol (ULTRAM) 50 MG tablet Take 1 tablet (50 mg total) every 8 (eight) hours as  needed by mouth. 02/27/17   Naida Sleight, PA-C      Allergies    Patient has no known allergies.    Review of Systems   Review of Systems  All other systems reviewed and are negative.   Physical Exam Updated Vital Signs BP (!) 162/97   Pulse (!) 57   Temp 99.4 F (37.4 C)   Resp 18   Ht 6\' 4"  (1.93 m)   Wt 104.3 kg   SpO2 95%   BMI 28.00 kg/m  Physical Exam Vitals and nursing note reviewed.  Constitutional:      General: He is not in acute distress.    Appearance: Normal appearance.  HENT:     Head: Normocephalic and atraumatic.  Eyes:     General:        Right eye: No discharge.        Left eye: No discharge.  Cardiovascular:     Comments: Regular rate and rhythm.  S1/S2 are distinct without any evidence of murmur, rubs, or gallops.  Radial pulses are 2+ bilaterally.  Dorsalis pedis pulses are 2+ bilaterally.  No evidence of pedal edema. Pulmonary:     Comments: Clear to auscultation bilaterally.  Normal effort.  No respiratory distress.  No evidence of wheezes, rales, or rhonchi heard throughout. Abdominal:     General: Abdomen is flat. Bowel sounds are normal. There is no distension.     Tenderness: There is no abdominal tenderness. There is no  guarding or rebound.  Musculoskeletal:        General: Normal range of motion.     Cervical back: Neck supple.  Skin:    General: Skin is warm and dry.     Findings: No rash.  Neurological:     General: No focal deficit present.     Mental Status: He is alert.  Psychiatric:        Mood and Affect: Mood normal.        Behavior: Behavior normal.     ED Results / Procedures / Treatments   Labs (all labs ordered are listed, but only abnormal results are displayed) Labs Reviewed  BASIC METABOLIC PANEL - Abnormal; Notable for the following components:      Result Value   Sodium 134 (*)    All other components within normal limits  CBC WITH DIFFERENTIAL/PLATELET  TROPONIN I (HIGH SENSITIVITY)  TROPONIN I (HIGH  SENSITIVITY)    EKG None  Radiology No results found.  Procedures Procedures    Medications Ordered in ED Medications  metoprolol tartrate (LOPRESSOR) tablet 25 mg (has no administration in time range)    ED Course/ Medical Decision Making/ A&P                           Medical Decision Making [name] is a 41 y.o. male patient [facility] to the [facility] today for further evaluation of elevated blood pressures.  Patient does not meet criteria for hypertensive urgency or emergency.  Patient takes metoprolol nightly. His blood pressure currently is 150s systolic.  Patient's heart rate is in the 50s.  We will hold off on giving his metoprolol.  I personally interpreted single troponin and basic labs.  These were normal.  EKG shows normal sinus rhythm without any significant ST changes.  I will have patient follow-up with his primary care doctor for further evaluation of elevated blood pressure.  I am concerned that maybe the metoprolol is dropping his heart rate too low causing the lightheadedness.  Patient has a follow-up appointment tomorrow with his primary care doctor for further evaluation.  Patient will follow-up with him as scheduled.  He is safe for discharge.   Amount and/or Complexity of Data Reviewed Labs: ordered.   Final Clinical Impression(s) / ED Diagnoses Final diagnoses:  Elevated blood pressure reading    Rx / DC Orders ED Discharge Orders     None         Jolyn Lent 10/29/21 2105    Cheryll Cockayne, MD 11/04/21 2321

## 2023-03-13 ENCOUNTER — Encounter (HOSPITAL_BASED_OUTPATIENT_CLINIC_OR_DEPARTMENT_OTHER): Payer: Self-pay | Admitting: Student

## 2023-03-13 ENCOUNTER — Ambulatory Visit (HOSPITAL_BASED_OUTPATIENT_CLINIC_OR_DEPARTMENT_OTHER): Payer: PRIVATE HEALTH INSURANCE

## 2023-03-13 ENCOUNTER — Other Ambulatory Visit (HOSPITAL_BASED_OUTPATIENT_CLINIC_OR_DEPARTMENT_OTHER): Payer: Self-pay | Admitting: Student

## 2023-03-13 ENCOUNTER — Ambulatory Visit (INDEPENDENT_AMBULATORY_CARE_PROVIDER_SITE_OTHER): Payer: 59

## 2023-03-13 ENCOUNTER — Ambulatory Visit (INDEPENDENT_AMBULATORY_CARE_PROVIDER_SITE_OTHER): Payer: 59 | Admitting: Student

## 2023-03-13 ENCOUNTER — Other Ambulatory Visit (HOSPITAL_BASED_OUTPATIENT_CLINIC_OR_DEPARTMENT_OTHER): Payer: Self-pay

## 2023-03-13 DIAGNOSIS — M5441 Lumbago with sciatica, right side: Secondary | ICD-10-CM

## 2023-03-13 DIAGNOSIS — Z9889 Other specified postprocedural states: Secondary | ICD-10-CM

## 2023-03-13 MED ORDER — METHYLPREDNISOLONE 4 MG PO TBPK
ORAL_TABLET | ORAL | 0 refills | Status: DC
  Filled 2023-03-13: qty 21, 6d supply, fill #0

## 2023-03-13 MED ORDER — METHOCARBAMOL 500 MG PO TABS
500.0000 mg | ORAL_TABLET | Freq: Four times a day (QID) | ORAL | 0 refills | Status: DC
  Filled 2023-03-13: qty 40, 10d supply, fill #0

## 2023-03-13 NOTE — Progress Notes (Signed)
Chief Complaint: Right-sided low back pain     History of Present Illness:    Edward Stanley is a 42 y.o. male presenting with 3-day history of right-sided low back pain.  He does have history of an L5-S1 microdiscectomy with Dr. Otelia Sergeant in 2018.  He states that earlier this week he was playing golf in [facility] and felt an immediate pain in the right side of his low back during swing.  This did subside over time however the pain returned yesterday.  He rates pain today at a 7/10 and states that he does have a little bit of radiating pain into the back of the right hip.  Does report that his back feels like it is going to give out.  Has taken ibuprofen but denies any other treatments.  Denies any numbness or tingling.   Surgical History:   L5-S1 microdiscectomy 2018  PMH/PSH/Family History/Social History/Meds/Allergies:    Past Medical History:  Diagnosis Date   GERD (gastroesophageal reflux disease)    HNP (herniated nucleus pulposus)    L5-S1   HTN (hypertension)    Past Surgical History:  Procedure Laterality Date   LAPAROSCOPIC APPENDECTOMY N/A 08/19/2014   Procedure: APPENDECTOMY LAPAROSCOPIC;  Surgeon: Manus Rudd, MD;  Location: [facility] OR;  Service: General;  Laterality: N/A;   LUMBAR LAMINECTOMY N/A 07/16/2016   Procedure: Bilateral L5-S1 MICRODISCECTOMY;  Surgeon: Kerrin Champagne, MD;  Location: [facility] OR;  Service: Orthopedics;  Laterality: N/A;   None     Social History   Socioeconomic History   Marital status: Married    Spouse name: Not on file   Number of children: 1   Years of education: Not on file   Highest education level: Not on file  Occupational History   Occupation: Sales  Tobacco Use   Smoking status: Never   Smokeless tobacco: Never  Substance and Sexual Activity   Alcohol use: Yes    Alcohol/week: 7.0 standard drinks of alcohol    Types: 7 Cans of beer per week    Comment: daily   Drug use: No   Sexual activity: Not  on file  Other Topics Concern   Not on file  Social History Narrative   Not on file   Social Determinants of Health   Financial Resource Strain: Low Risk  (07/29/2022)   [facility]   Overall Financial Resource Strain (CARDIA)    Difficulty of Paying Living Expenses: Not hard at all  Food Insecurity: No Food Insecurity (07/29/2022)   Received from [facility]   Hunger Vital Sign    Worried About Running Out of Food in the Last Year: Never true    Ran Out of Food in the Last Year: Never true  Transportation Needs: No Transportation Needs (07/29/2022)   Received from [facility] - Transportation    Lack of Transportation (Medical): No    Lack of Transportation (Non-Medical): No  Physical Activity: Sufficiently Active (10/29/2021)   Received from [facility]   Exercise Vital Sign    Days of Exercise per Week: 5 days    Minutes of Exercise per Session: 40 min  Stress: No Stress Concern Present (10/29/2021)   Received from [facility] - Occupational Stress Questionnaire    Feeling of  Stress : Only a little  Social Connections: Unknown (11/02/2022)   Received from [facility]   Social Network    Social Network: Not on file   Family History  Problem Relation Age of Onset   Heart disease Father    Heart attack Paternal Grandfather    Prostate cancer Paternal Grandfather    Breast cancer Paternal Grandmother    No Known Allergies Current Outpatient Medications  Medication Sig Dispense Refill   methocarbamol (ROBAXIN) 500 MG tablet Take 1 tablet (500 mg total) by mouth 4 (four) times daily for 10 days. 40 tablet 0   methylPREDNISolone (MEDROL DOSEPAK) 4 MG TBPK tablet Take per packet instructions 21 tablet 0   CARAFATE 1 GM/10ML suspension Take 10 mLs by mouth 4 (four) times daily as needed for heartburn.  1   DEXILANT 60 MG capsule Take 60 mg by mouth daily.   3   lisinopril-hydrochlorothiazide  (PRINZIDE,ZESTORETIC) 20-12.5 MG tablet TAKE 2 TABLET BY MOUTH DAILY.     Probiotic Product (PROBIOTIC DAILY) CAPS Take 1 capsule by mouth daily.     traMADol (ULTRAM) 50 MG tablet Take 1 tablet (50 mg total) every 8 (eight) hours as needed by mouth. 50 tablet 0   No current facility-administered medications for this visit.   No results found.  Review of Systems:   A ROS was performed including pertinent positives and negatives as documented in the HPI.  Physical Exam :   Constitutional: NAD and appears stated age Neurological: Alert and oriented Psych: Appropriate affect and cooperative There were no vitals taken for this visit.   Comprehensive Musculoskeletal Exam:    Tenderness in the right lower lumbar region without any tenderness over the midline.  Good range of motion with lumbar flexion and rotation.  Bilateral passive hip ROM to 120 degrees flexion, 30 degrees ER, and 20 degrees IR.  Negative straight leg raise and FABER bilaterally.  Knee flexion/extension and ankle dorsiflexion/plantarflexion strength 5/5.  Imaging:   Xray (lumbar spine 4 views): Significant decrease in intervertebral space at L5-S1 at site of previous discectomy.  Anterior osteophyte noted of L5 vertebral body.  Otherwise negative.   I personally reviewed and interpreted the radiographs.   Assessment:   42 y.o. male with right-sided low back pain radiating along the posterior right hip.  He does have history of a L5-S1 microdiscectomy 6 years ago and denies any significant issues before his injury earlier this week.  He does not have any strength or neurodeficits.  I would recommend beginning symptomatic treatment with a steroid pack and muscle relaxer which she has responded well to in the past.  Discussed that should symptoms worsen or not improve with conservative management I would like to see him back and would consider further imaging at that time.  Plan :    -Start Medrol Dosepak as instructed and  methocarbamol as needed -Return to clinic should symptoms worsen or fail to improve     I personally saw and evaluated the patient, and participated in the management and treatment plan.  Hazle Nordmann, PA-C Orthopedics

## 2023-03-18 ENCOUNTER — Other Ambulatory Visit: Payer: Self-pay

## 2023-03-18 ENCOUNTER — Emergency Department (HOSPITAL_COMMUNITY): Payer: 59

## 2023-03-18 ENCOUNTER — Emergency Department (HOSPITAL_COMMUNITY)
Admission: EM | Admit: 2023-03-18 | Discharge: 2023-03-18 | Disposition: A | Discharge status: Home / Self Care | Payer: 59 | Attending: Emergency Medicine | Admitting: Emergency Medicine

## 2023-03-18 DIAGNOSIS — R0789 Other chest pain: Secondary | ICD-10-CM | POA: Diagnosis present

## 2023-03-18 DIAGNOSIS — R001 Bradycardia, unspecified: Secondary | ICD-10-CM | POA: Diagnosis not present

## 2023-03-18 DIAGNOSIS — Z79899 Other long term (current) drug therapy: Secondary | ICD-10-CM | POA: Insufficient documentation

## 2023-03-18 DIAGNOSIS — R0602 Shortness of breath: Secondary | ICD-10-CM | POA: Diagnosis not present

## 2023-03-18 LAB — BASIC METABOLIC PANEL
Anion gap: 10 (ref 5–15)
BUN: 15 mg/dL (ref 6–20)
CO2: 26 mmol/L (ref 22–32)
Calcium: 9.5 mg/dL (ref 8.9–10.3)
Chloride: 103 mmol/L (ref 98–111)
Creatinine, Ser: 0.77 mg/dL (ref 0.61–1.24)
GFR, Estimated: >60 mL/min (ref >60)
Glucose, Bld: 96 mg/dL (ref 70–99)
Potassium: 4 mmol/L (ref 3.5–5.1)
Sodium: 139 mmol/L (ref 135–145)

## 2023-03-18 LAB — CBC WITH DIFFERENTIAL/PLATELET
Abs Immature Granulocytes: 0.04 10*3/uL (ref 0.00–0.07)
Basophils Absolute: 0 10*3/uL (ref 0.0–0.1)
Basophils Relative: 0 %
Eosinophils Absolute: 0.1 10*3/uL (ref 0.0–0.5)
Eosinophils Relative: 1 %
HCT: 39.8 % (ref 39.0–52.0)
Hemoglobin: 13.6 g/dL (ref 13.0–17.0)
Immature Granulocytes: 1 %
Lymphocytes Relative: 37 %
Lymphs Abs: 2.9 10*3/uL (ref 0.7–4.0)
MCH: 30.5 pg (ref 26.0–34.0)
MCHC: 34.2 g/dL (ref 30.0–36.0)
MCV: 89.2 fL (ref 80.0–100.0)
Monocytes Absolute: 0.7 10*3/uL (ref 0.1–1.0)
Monocytes Relative: 9 %
Neutro Abs: 4 10*3/uL (ref 1.7–7.7)
Neutrophils Relative %: 52 %
Platelets: 212 10*3/uL (ref 150–400)
RBC: 4.46 MIL/uL (ref 4.22–5.81)
RDW: 12.1 % (ref 11.5–15.5)
WBC: 7.8 10*3/uL (ref 4.0–10.5)
nRBC: 0 % (ref 0.0–0.2)

## 2023-03-18 LAB — D-DIMER, QUANTITATIVE: D-Dimer, Quant: <0.27 ug{FEU}/mL (ref 0.00–0.50)

## 2023-03-18 LAB — TROPONIN I (HIGH SENSITIVITY): Troponin I (High Sensitivity): 3 ng/L (ref <18)

## 2023-03-18 MED ORDER — ASPIRIN 81 MG PO CHEW
324.0000 mg | CHEWABLE_TABLET | Freq: Once | ORAL | Status: AC
  Administered 2023-03-18: 324 mg via ORAL
  Filled 2023-03-18: qty 4

## 2023-03-18 NOTE — Discharge Instructions (Signed)
Continue to take your olmesartan as directed.  For now we will STOP your metoprolol as I think this is causing your low heart rate.  Call your doctor today to help set up urgent follow-up with your primary care physician (within a week or so) where they can recheck your blood pressure and heart rate.  Otherwise, if you develop new or worsening lightheadedness, passing out, chest pain or tightness, trouble breathing, or any other new/concerning symptoms and return to the ER or call 911.

## 2023-03-18 NOTE — ED Triage Notes (Signed)
Pt. Stated, Last night around 1130 I felt this chest pain go across my chest and I thought it was indigestion, I was in bed.  The pain continued all through the night and this morning.

## 2023-03-18 NOTE — ED Provider Notes (Signed)
[place] EMERGENCY DEPARTMENT AT [facility] Provider Note   CSN: [id number] Arrival date & time: 03/18/23  3762     History  Chief Complaint  Patient presents with   Chest Pain   Shortness of Breath    Edward Stanley is a 42 y.o. male.  HPI 42 year old male presents with chest tightness.  Started last night around 11 PM.  Felt different than when he has had reflux in the past.  It is been pretty much constant.  If he turns onto his left or right side he will notice worsening symptoms but if he sits up or lays back or starts walking it gets better.  No pleuritic symptoms.  He travels domestically on airplanes often but has not noticed any leg swelling.  Right now he feels a little lightheaded since arriving in the ER but he thinks that might also be because they told him his blood pressure was high.  He takes olmesartan as well as metoprolol at night.  It seems like the metoprolol is due to high heart rates whenever he has pain such as from GERD.  No cough.  No back or abdominal pain or radiation.  Home Medications Prior to Admission medications   Medication Sig Start Date End Date Taking? Authorizing Provider  metoprolol succinate (TOPROL-XL) 25 MG 24 hr tablet Take 25 mg by mouth at bedtime. 09/27/22  Yes [provider]  olmesartan (BENICAR) 20 MG tablet Take 20 mg by mouth daily. 12/02/22  Yes [provider]  Probiotic Product (PROBIOTIC DAILY) CAPS Take 1 capsule by mouth daily.   Yes [provider]  sucralfate (CARAFATE) 1 g tablet Take 1 g by mouth daily as needed.   Yes [provider]      Allergies    Patient has no known allergies.    Review of Systems   Review of Systems  Respiratory:  Positive for chest tightness. Negative for cough.   Cardiovascular:  Negative for leg swelling.  Gastrointestinal:  Negative for abdominal pain.  Neurological:  Positive for light-headedness.    Physical Exam Updated Vital Signs BP  (!) 155/95   Pulse (!) 42   Temp 98.1 F (36.7 C) (Oral)   Resp 13   Ht 6\' 4"  (1.93 m)   Wt 108.9 kg   SpO2 100%   BMI 29.21 kg/m  Physical Exam Vitals and nursing note reviewed.  Constitutional:      Appearance: He is well-developed.  HENT:     Head: Normocephalic and atraumatic.  Cardiovascular:     Rate and Rhythm: Regular rhythm. Bradycardia present.     Heart sounds: Normal heart sounds.  Pulmonary:     Effort: Pulmonary effort is normal.     Breath sounds: Normal breath sounds.  Chest:     Chest wall: No tenderness.  Abdominal:     Palpations: Abdomen is soft.     Tenderness: There is no abdominal tenderness.  Musculoskeletal:     Right lower leg: No edema.     Left lower leg: No edema.  Skin:    General: Skin is warm and dry.  Neurological:     Mental Status: He is alert.     ED Results / Procedures / Treatments   Labs (all labs ordered are listed, but only abnormal results are displayed) Labs Reviewed  D-DIMER, QUANTITATIVE  BASIC METABOLIC PANEL  CBC WITH DIFFERENTIAL/PLATELET  CBC WITH DIFFERENTIAL/PLATELET  TROPONIN I (HIGH SENSITIVITY)    EKG  None  Radiology DG Chest Portable 1 View  Result Date: 03/18/2023 CLINICAL DATA:  Acute chest pain beginning last night. EXAM: PORTABLE CHEST 1 VIEW COMPARISON:  None Available. FINDINGS: The heart size and mediastinal contours are within normal limits. Both lungs are clear. The visualized skeletal structures are unremarkable. IMPRESSION: No active disease. Electronically Signed   By: Danae Orleans M.D.   On: 03/18/2023 08:17    Procedures Procedures    Medications Ordered in ED Medications  aspirin chewable tablet 324 mg (324 mg Oral Given 03/18/23 0809)    ED Course/ Medical Decision Making/ A&P                                 Medical Decision Making Amount and/or Complexity of Data Reviewed Labs: ordered.    Details: Normal labs including normal troponin Radiology: ordered and independent  interpretation performed.    Details: No CHF or pneumothorax ECG/medicine tests: ordered and independent interpretation performed.    Details: Has bradycardia but no ischemia  Risk OTC drugs.   Patient's chest tightness is atypical.  Worse with certain positions but no exertional component.  No pleuritic component.  However he does frequently travel so a D-dimer was sent for otherwise low risk PE and is negative.  Troponin is negative and has been having symptoms for 8+ hours.  Given a low suspicion I think he does not need a second troponin.  Talking to him more about his lightheadedness, he has been assuming its his blood pressure but has been feeling on and off a little lightheaded all week.  Does not feel like he is about to pass out but just feels a little off.  Could be his heart rate, which has at times gone into the low 40s but other times is in the 50s-60s.  He is on metoprolol because he used to get tachycardic when he would get pain and this was to help control this.  I discussed with them that for now he should stop the metoprolol and follow-up with his primary care physician.  He states his blood pressure has been under better control on recent visits and so while it is elevated here, we will have him recheck this with his PCP and see if he needs adjustment in his meds there.  Otherwise, he is well-appearing, his chest tightness is improving, and he appears stable for discharge home with return precautions.        Final Clinical Impression(s) / ED Diagnoses Final diagnoses:  Chest tightness  Sinus bradycardia    Rx / DC Orders ED Discharge Orders     None         Pricilla Loveless, MD 03/18/23 [phone]

## 2023-04-10 ENCOUNTER — Encounter (HOSPITAL_BASED_OUTPATIENT_CLINIC_OR_DEPARTMENT_OTHER): Payer: Self-pay

## 2023-06-02 ENCOUNTER — Ambulatory Visit: Payer: 59 | Admitting: Orthopedic Surgery

## 2023-06-05 ENCOUNTER — Ambulatory Visit (INDEPENDENT_AMBULATORY_CARE_PROVIDER_SITE_OTHER): Payer: 59 | Admitting: Orthopedic Surgery

## 2023-06-05 VITALS — BP 145/96 | HR 96 | Ht 76.0 in | Wt 247.0 lb

## 2023-06-05 DIAGNOSIS — M5416 Radiculopathy, lumbar region: Secondary | ICD-10-CM

## 2023-06-05 NOTE — Progress Notes (Unsigned)
Orthopedic Spine Surgery Office Note  Assessment: Patient is a 43 y.o. male with low back pain and decreased sensation in the posterior aspect of his thigh and right leg on the right side, suspect radiculopathy   Plan: -Explained that initially conservative treatment is tried as a significant number of patients may experience relief with these treatment modalities. Discussed that the conservative treatments include:  -activity modification  -physical therapy  -over the counter pain medications  -medrol dosepak  -lumbar steroid injections -Patient has tried medrol dose pak, robaxin, motrin -Can use tylenol up to 1000mg  TID -since patient has tried over six weeks of conservative with Hazle Nordmann, PA-C (started 03/13/2023), recommended MRI of the lumbar spine to evaluate for radiculopathy -Will call him with the results of the MRI. Briefly discussed injection as a next treatment option for him in office today   Patient expressed understanding of the plan and all questions were answered to the patient's satisfaction.   ___________________________________________________________________________   History:  Patient is a 43 y.o. male who presents today for lumbar spine. Patient has a history of an L5/S1 microdiscectomy with Dr. Otelia Sergeant in 2018 for right sided radicular symptoms. He got better after surgery but then within the last 3 months, has noted return of symptoms. He has noticed significant low back pain and numbness going down the back of his leg. He feels it in the posterior aspect of his thigh and leg on the right side. No symptoms in the left lower extremity. He believes his symptoms started after playing but he does not recall any specific injury or event that happened when he was playing in 02/2023.    Weakness: denies  Symptoms of imbalance: denies Paresthesias and numbness: yes, has numbness on the posterior aspect of his right thigh and leg. No other numbness or  paresthesias Bowel or bladder incontinence: denies Saddle anesthesia: denies  Treatments tried: motrin, medrol dose pak, robaxin  Review of systems: Denies fevers and chills, night sweats, unexplained weight loss, history of cancer, pain that wakes them at night  Past medical history: HTN GERD  Allergies: NKDA  Past surgical history:  L5/S1 microdiscectomy  Social history: Denies use of nicotine product (smoking, vaping, patches, smokeless) Alcohol use: Yes, approximately 8-10 drinks per week Denies recreational drug use   Physical Exam:  BMI of 30.1  General: no acute distress, appears stated age Neurologic: alert, answering questions appropriately, following commands Respiratory: unlabored breathing on room air, symmetric chest rise Psychiatric: appropriate affect, normal cadence to speech   MSK (spine):  -Strength exam      Left  Right EHL    5/5  5/5 TA    5/5  5/5 GSC    5/5  5/5 Knee extension  5/5  5/5 Hip flexion   5/5  5/5  -Sensory exam    Sensation intact to light touch in L3-S1 nerve distributions of bilateral lower extremities  -Achilles DTR: 2/4 on the left, 2/4 on the right -Patellar tendon DTR: 2/4 on the left, 2/4 on the right  -Straight leg raise: negative bilaterally  -Femoral nerve stretch test: negative bilaterally -Clonus: no beats bilaterally  -Left hip exam: no pain through range of motion, negative stinchfield, negative FABER -Right hip exam: no pain through range of motion, negative stinchfield, negative FABER  Imaging: XRs of the lumbar spine from 02/28/2023 were independently reviewed and interpreted, showing disc height loss at L5/S1. No other significant degenerative changes seen. No evidence of instability on flexion/extension views. No fracture or  dislocation seen.    Patient name: Edward Stanley Patient MRN: [id number] Date of visit: 06/05/23

## 2023-06-16 ENCOUNTER — Encounter: Payer: Self-pay | Admitting: Orthopedic Surgery

## 2023-06-16 ENCOUNTER — Ambulatory Visit
Admission: RE | Admit: 2023-06-16 | Discharge: 2023-06-16 | Disposition: A | Discharge status: Home / Self Care | Payer: 59 | Source: Ambulatory Visit | Attending: Orthopedic Surgery

## 2023-06-16 DIAGNOSIS — M5416 Radiculopathy, lumbar region: Secondary | ICD-10-CM

## 2023-06-16 MED ORDER — DICLOFENAC SODIUM 75 MG PO TBEC: 75.0000 mg | DELAYED_RELEASE_TABLET | Freq: Two times a day (BID) | ORAL | 0 refills | Status: AC

## 2023-06-17 NOTE — Addendum Note (Signed)
 Addended by: Willia Craze on: 06/17/2023 02:23 PM   Modules accepted: Orders

## 2023-06-26 ENCOUNTER — Other Ambulatory Visit: Payer: Self-pay

## 2023-06-26 ENCOUNTER — Ambulatory Visit: Admitting: Physical Medicine and Rehabilitation

## 2023-06-26 VITALS — BP 149/97 | HR 85

## 2023-06-26 DIAGNOSIS — M5416 Radiculopathy, lumbar region: Secondary | ICD-10-CM | POA: Diagnosis not present

## 2023-06-26 MED ORDER — METHYLPREDNISOLONE ACETATE 40 MG/ML IJ SUSP
40.0000 mg | Freq: Once | INTRAMUSCULAR | Status: AC
  Administered 2023-06-26: 40 mg

## 2023-06-26 NOTE — Patient Instructions (Signed)

## 2023-06-26 NOTE — Progress Notes (Signed)
 Pain Scale   Average Pain 0        +Driver, -BT, -Dye Allergies.

## 2023-07-01 NOTE — Procedures (Signed)
 S1 Lumbosacral Transforaminal Epidural Steroid Injection - Sub-Pedicular Approach with Fluoroscopic Guidance   Patient: Edward Stanley      Date of Birth: Jun 23, 1980 MRN: [id number] PCP: Eartha Inch, MD      Visit Date: 06/26/2023   Universal Protocol:    Date/Time: 03/11/257:47 PM  Consent Given By: the patient  Position:  PRONE  Additional Comments: Vital signs were monitored before and after the procedure. Patient was prepped and draped in the usual sterile fashion. The correct patient, procedure, and site was verified.   Injection Procedure Details:  Procedure Site One Meds Administered:  Meds ordered this encounter  Medications   methylPREDNISolone acetate (DEPO-MEDROL) injection 40 mg    Laterality: Right  Location/Site:  S1 Foramen   Needle size: 22 ga.  Needle type: Spinal  Needle Placement: Transforaminal  Findings:   -Comments: Excellent flow of contrast along the nerve, nerve root and into the epidural space.  Patient had a vasovagal episode towards the end of the procedure and was treated accordingly and discharged doing well.  Epidurogram: Contrast epidurogram showed no nerve root cut off or restricted flow pattern.  Procedure Details: After squaring off the sacral end-plate to get a true AP view, the C-arm was positioned so that the best possible view of the S1 foramen was visualized. The soft tissues overlying this structure were infiltrated with 2-3 ml. of 1% Lidocaine without Epinephrine.    The spinal needle was inserted toward the target using a "trajectory" view along the fluoroscope beam.  Under AP and lateral visualization, the needle was advanced so it did not puncture dura. Biplanar projections were used to confirm position. Aspiration was confirmed to be negative for CSF and/or blood. A 1-2 ml. volume of Isovue-250 was injected and flow of contrast was noted at each level. Radiographs were obtained for documentation purposes.   After  attaining the desired flow of contrast documented above, a 0.5 to 1.0 ml test dose of 0.25% Marcaine was injected into each respective transforaminal space.  The patient was observed for 90 seconds post injection.  After no sensory deficits were reported, and normal lower extremity motor function was noted,   the above injectate was administered so that equal amounts of the injectate were placed at each foramen (level) into the transforaminal epidural space.   Additional Comments:   Dressing: Band-Aid with 2 x 2 sterile gauze    Post-procedure details: Patient was observed during the procedure. Post-procedure instructions were reviewed.  Patient left the clinic in stable condition.

## 2023-07-01 NOTE — Progress Notes (Signed)
 Edward Stanley - 43 y.o. male MRN [id number]  Date of birth: [birth year]  Office Visit Note: Visit Date: 06/26/2023 PCP: Eartha Inch, MD Referred by: London Sheer, MD  Subjective: Chief Complaint  Patient presents with   Lower Back - Pain   HPI:  Edward Stanley is a 43 y.o. male who comes in today at the request of Dr. Willia Craze for planned Right L5-S1 Lumbar Transforaminal epidural steroid injection with fluoroscopic guidance.  Patient had pretty classic S1 dermatome symptoms with MRI findings with foraminal and lateral recess disc herniation.  I elected to complete an S1 transforaminal injection today.  Please see the procedure note but he did have a vasovagal episode at the end but was discharged doing well.  The patient has failed conservative care including home exercise, medications, time and activity modification.  This injection will be diagnostic and hopefully therapeutic.  Please see requesting physician notes for further details and justification.   ROS Otherwise per HPI.  Assessment & Plan: Visit Diagnoses:    ICD-10-CM   1. Lumbar radiculopathy  M54.16 XR C-ARM NO REPORT    Epidural Steroid injection    methylPREDNISolone acetate (DEPO-MEDROL) injection 40 mg      Plan: No additional findings.   Meds & Orders:  Meds ordered this encounter  Medications   methylPREDNISolone acetate (DEPO-MEDROL) injection 40 mg    Orders Placed This Encounter  Procedures   XR C-ARM NO REPORT   Epidural Steroid injection    Follow-up: Return for visit to requesting provider as needed.   Procedures: No procedures performed  S1 Lumbosacral Transforaminal Epidural Steroid Injection - Sub-Pedicular Approach with Fluoroscopic Guidance   Patient: Edward Stanley      Date of Birth: 12-05-1980 MRN: [id number] PCP: Eartha Inch, MD      Visit Date: 06/26/2023   Universal Protocol:    Date/Time: 03/11/257:47 PM  Consent Given By: the patient  Position:   PRONE  Additional Comments: Vital signs were monitored before and after the procedure. Patient was prepped and draped in the usual sterile fashion. The correct patient, procedure, and site was verified.   Injection Procedure Details:  Procedure Site One Meds Administered:  Meds ordered this encounter  Medications   methylPREDNISolone acetate (DEPO-MEDROL) injection 40 mg    Laterality: Right  Location/Site:  S1 Foramen   Needle size: 22 ga.  Needle type: Spinal  Needle Placement: Transforaminal  Findings:   -Comments: Excellent flow of contrast along the nerve, nerve root and into the epidural space.  Patient had a vasovagal episode towards the end of the procedure and was treated accordingly and discharged doing well.  Epidurogram: Contrast epidurogram showed no nerve root cut off or restricted flow pattern.  Procedure Details: After squaring off the sacral end-plate to get a true AP view, the C-arm was positioned so that the best possible view of the S1 foramen was visualized. The soft tissues overlying this structure were infiltrated with 2-3 ml. of 1% Lidocaine without Epinephrine.    The spinal needle was inserted toward the target using a "trajectory" view along the fluoroscope beam.  Under AP and lateral visualization, the needle was advanced so it did not puncture dura. Biplanar projections were used to confirm position. Aspiration was confirmed to be negative for CSF and/or blood. A 1-2 ml. volume of Isovue-250 was injected and flow of contrast was noted at each level. Radiographs were obtained for documentation purposes.   After attaining the desired flow of contrast  documented above, a 0.5 to 1.0 ml test dose of 0.25% Marcaine was injected into each respective transforaminal space.  The patient was observed for 90 seconds post injection.  After no sensory deficits were reported, and normal lower extremity motor function was noted,   the above injectate was  administered so that equal amounts of the injectate were placed at each foramen (level) into the transforaminal epidural space.   Additional Comments:   Dressing: Band-Aid with 2 x 2 sterile gauze    Post-procedure details: Patient was observed during the procedure. Post-procedure instructions were reviewed.  Patient left the clinic in stable condition.   Clinical History: No specialty comments available.     Objective:  VS:  HT:    WT:   BMI:     BP:(!) 149/97  HR:85bpm  TEMP: ( )  RESP:  Physical Exam Vitals and nursing note reviewed.  Constitutional:      General: He is not in acute distress.    Appearance: Normal appearance. He is not ill-appearing.  HENT:     Head: Normocephalic and atraumatic.     Right Ear: External ear normal.     Left Ear: External ear normal.     Nose: No congestion.  Eyes:     Extraocular Movements: Extraocular movements intact.  Cardiovascular:     Rate and Rhythm: Normal rate.     Pulses: Normal pulses.  Pulmonary:     Effort: Pulmonary effort is normal. No respiratory distress.  Abdominal:     General: There is no distension.     Palpations: Abdomen is soft.  Musculoskeletal:        General: No tenderness or signs of injury.     Cervical back: Neck supple.     Right lower leg: No edema.     Left lower leg: No edema.     Comments: Patient has good distal strength without clonus.  Skin:    Findings: No erythema or rash.  Neurological:     General: No focal deficit present.     Mental Status: He is alert and oriented to person, place, and time.     Sensory: No sensory deficit.     Motor: No weakness or abnormal muscle tone.     Coordination: Coordination normal.  Psychiatric:        Mood and Affect: Mood normal.        Behavior: Behavior normal.      Imaging: No results found.

## 2023-07-02 ENCOUNTER — Ambulatory Visit (INDEPENDENT_AMBULATORY_CARE_PROVIDER_SITE_OTHER): Admitting: Orthopedic Surgery

## 2023-07-02 DIAGNOSIS — M5416 Radiculopathy, lumbar region: Secondary | ICD-10-CM

## 2023-07-02 MED ORDER — PREGABALIN 75 MG PO CAPS: 75.0000 mg | ORAL_CAPSULE | Freq: Two times a day (BID) | ORAL | 1 refills | Status: DC

## 2023-07-02 NOTE — Progress Notes (Signed)
 Orthopedic Spine Surgery Office Note   Assessment: Patient is a 43 y.o. male with low back pain and decreased sensation in the posterior aspect of his thigh and right leg on the right side. 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 Went over some of the pros/cons -Patient will let me know how he is doing in six weeks via mychart     Patient expressed understanding of the plan and all questions were answered to the patient's satisfaction.    ___________________________________________________________________________     History:   Patient is a 43 y.o. male who presents today for follow up on his lumbar spine.  After last visit, patient got an injection with Dr. Alvester Morin.  He said that he got no relief with that injection.  He still having symptoms in his right lower extremity.  He is not noticed any changes since her last visit.  Treatments tried: medrol dose pak, robaxin, motrin, diclofenac, lumbar steroid injection    Physical Exam:  General: no acute distress, appears stated age Neurologic: alert, answering questions appropriately, following commands Respiratory: unlabored breathing on room air, symmetric chest rise Psychiatric: appropriate affect, normal cadence to speech     MSK (spine):   -Strength exam                                                   Left                  Right EHL                              5/5                  5/5 TA                                 5/5                  5/5 GSC                             5/5                  5/5 Knee extension            5/5                  5/5 Hip flexion                    5/5                  5/5   -Sensory exam                           Sensation  intact to light touch in L2-S1 nerve distributions of bilateral lower extremities     Imaging: XRs of the lumbar spine from 02/28/2023 were previously independently reviewed and interpreted, showing disc  height loss at L5/S1. No other significant degenerative changes seen. No evidence of instability on flexion/extension views. No fracture or dislocation seen.   MRI of the lumbar spine from 06/12/2023 was independently reviewed and interpreted, showing small central disc herniation at L4/5 that appears noncompressive.  DDD at L5/S1.  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  No other significant stenosis seen.     Patient name: Edward Stanley Patient MRN: [id number] Date of visit: 07/02/23

## 2023-07-11 ENCOUNTER — Ambulatory Visit: Attending: Cardiovascular Disease | Admitting: Cardiovascular Disease

## 2023-07-11 ENCOUNTER — Encounter: Payer: Self-pay | Admitting: Cardiovascular Disease

## 2023-07-11 VITALS — BP 118/82 | HR 100 | Ht 76.0 in | Wt 248.6 lb

## 2023-07-11 DIAGNOSIS — R072 Precordial pain: Secondary | ICD-10-CM

## 2023-07-11 DIAGNOSIS — R079 Chest pain, unspecified: Secondary | ICD-10-CM

## 2023-07-11 DIAGNOSIS — R9431 Abnormal electrocardiogram [ECG] [EKG]: Secondary | ICD-10-CM

## 2023-07-11 MED ORDER — METOPROLOL TARTRATE 100 MG PO TABS: 100.0000 mg | ORAL_TABLET | Freq: Once | ORAL | 0 refills | Status: DC

## 2023-07-11 NOTE — Patient Instructions (Addendum)
 Medication Instructions:  No changes *If you need a refill on your cardiac medications before your next appointment, please call your pharmacy*   Lab Work: Go to the [employer] for blood work Designer, jewellery)   Testing/Procedures: Your physician has requested that you have an echocardiogram. Echocardiography is a painless test that uses sound waves to create images of your heart. It provides your doctor with information about the size and shape of your heart and how well your heart's chambers and valves are working. This procedure takes approximately one hour. There are no restrictions for this procedure. Please do NOT wear cologne, perfume, aftershave, or lotions (deodorant is allowed). Please arrive 15 minutes prior to your appointment time.  Please note: We ask at that you not bring children with you during ultrasound (echo/ vascular) testing. Due to room size and safety concerns, children are not allowed in the ultrasound rooms during exams. Our front office staff cannot provide observation of children in our lobby area while testing is being conducted. An adult accompanying a patient to their appointment will only be allowed in the ultrasound room at the discretion of the ultrasound technician under special circumstances. We apologize for any inconvenience.  Coronary CT Angiogram - see instructions below   Follow-Up: At [facility], you and your health needs are our priority.  As part of our continuing mission to provide you with exceptional heart care, we have created designated Provider Care Teams.  These Care Teams include your primary Cardiologist (physician) and Advanced Practice Providers (APPs -  Physician Assistants and Nurse Practitioners) who all work together to provide you with the care you need, when you need it.   Your next appointment:   12 month(s)  Provider:   Verne Carrow, MD   Other Instructions   Your cardiac CT will be scheduled at  [facility] [address] [place], [place] [address] [phone]  Please arrive at the [facility] and Children's Entrance (Entrance C2) of [facility] 30 minutes prior to test start time. You can use the FREE valet parking offered at entrance C (encouraged to control the heart rate for the test)  Proceed to the [facility] Radiology Department (first floor) to check-in and test prep.  All radiology patients and guests should use entrance C2 at [facility], accessed from [facility], even though the hospital's physical address listed is [address]     Please follow these instructions carefully (unless otherwise directed):  An IV will be required for this test and Nitroglycerin will be given.  Hold all erectile dysfunction medications at least 3 days (72 hrs) prior to test. (Ie viagra, cialis, sildenafil, tadalafil, etc)   On the Night Before the Test: Be sure to Drink plenty of water. Do not consume any caffeinated/decaffeinated beverages or chocolate 12 hours prior to your test. Do not take any antihistamines 12 hours prior to your test.  On the Day of the Test: Drink plenty of water until 1 hour prior to the test. Do not eat any food 1 hour prior to test. You may take your regular medications prior to the test.  Take metoprolol (Lopressor) two hours prior to test.  After the Test: Drink plenty of water. After receiving IV contrast, you may experience a mild flushed feeling. This is normal. On occasion, you may experience a mild rash up to 24 hours after the test. This is not dangerous. If this occurs, you can take Benadryl 25 mg, Zyrtec,  Claritin, or Allegra and increase your fluid intake. (Patients taking Tikosyn should avoid Benadryl, and may take Zyrtec, Claritin, or Allegra) If you experience trouble breathing, this can be serious. If it is severe call 911 IMMEDIATELY. If it is mild, please call our office.  We will call to schedule  your test 2-4 weeks out understanding that some insurance companies will need an authorization prior to the service being performed.   For more information and frequently asked questions, please visit our website : [email or web address]  For non-scheduling related questions, please contact the cardiac imaging nurse navigator should you have any questions/concerns: Cardiac Imaging Nurse Navigators Direct Office Dial: [phone]   For scheduling needs, including cancellations and rescheduling, please call [place], [phone].

## 2023-07-11 NOTE — Progress Notes (Signed)
 Chief Complaint  Patient presents with   New Patient (Initial Visit)    Chest pain     History of Present Illness: 43 yo male with history of HTN, GERD, and HLD here today to re-establish cardiac care. I saw him as a new patient for evaluation of chest pain in August 2015. Given his family history of CAD, I arranged an echo and a stress test. Echo 2015 with normal LV function and no valve disease. Treadmill stress test 2015 with no evidence of ischemia after 12 minutes of exercise. Chest pain resolved with treatment of GERD with PPI. We discussed coronary CT calcium scoring at that time but he declined. He was seen in the ED at [facility] on 03/18/23 with chest pain. Troponin negative. D-dimer negative. EKG with sinus bradycardia, LVH. He was bradycardic and his metoprolol was stopped. He has had issues with his blood pressure. Recently he has been put on Olmesartan/hydrochlorothiazide and his BP has been better controlled.   He tells me today that he has daily tightness in his chest. He has assumed this was heartburn. No dyspnea. He had been very active but his exercise has been limited due to back pain. He is seeing ortho with diagnosis of foraminal stenosis at L5/S1 and may need surgery.   Primary Care Physician: Eartha Inch, MD  Past Medical History:  Diagnosis Date   GERD (gastroesophageal reflux disease)    HNP (herniated nucleus pulposus)    L5-S1   HTN (hypertension)     Past Surgical History:  Procedure Laterality Date   LAPAROSCOPIC APPENDECTOMY N/A 08/19/2014   Procedure: APPENDECTOMY LAPAROSCOPIC;  Surgeon: Manus Rudd, MD;  Location: [facility] OR;  Service: General;  Laterality: N/A;   LUMBAR LAMINECTOMY N/A 07/16/2016   Procedure: Bilateral L5-S1 MICRODISCECTOMY;  Surgeon: Kerrin Champagne, MD;  Location: [facility] OR;  Service: Orthopedics;  Laterality: N/A;   None      Current Outpatient Medications  Medication Sig Dispense Refill   diclofenac (VOLTAREN) 75 MG EC tablet Take 1  tablet (75 mg total) by mouth 2 (two) times daily with a meal. 60 tablet 0   metoprolol tartrate (LOPRESSOR) 100 MG tablet Take 1 tablet (100 mg total) by mouth once for 1 dose. Take 90-120 minutes prior to scan. 1 tablet 0   olmesartan-hydrochlorothiazide (BENICAR HCT) 40-25 MG tablet Take 1 tablet by mouth daily.     pregabalin (LYRICA) 75 MG capsule Take 1 capsule (75 mg total) by mouth 2 (two) times daily. 60 capsule 1   Probiotic Product (PROBIOTIC DAILY) CAPS Take 1 capsule by mouth daily.     sucralfate (CARAFATE) 1 g tablet Take 1 g by mouth daily as needed.     No current facility-administered medications for this visit.    No Known Allergies  Social History   Socioeconomic History   Marital status: Married    Spouse name: Not on file   Number of children: 1   Years of education: Not on file   Highest education level: Not on file  Occupational History   Occupation: Sales  Tobacco Use   Smoking status: Never   Smokeless tobacco: Never  Substance and Sexual Activity   Alcohol use: Yes    Alcohol/week: 7.0 standard drinks of alcohol    Types: 7 Cans of beer per week    Comment: daily   Drug use: No   Sexual activity: Not on file  Other Topics Concern   Not on file  Social History Narrative  Not on file   Social Drivers of Health   Financial Resource Strain: Low Risk  (06/27/2023)   Received from [facility]   Overall Financial Resource Strain (CARDIA)    Difficulty of Paying Living Expenses: Not hard at all  Food Insecurity: No Food Insecurity (06/27/2023)   Received from [facility]   Hunger Vital Sign    Worried About Running Out of Food in the Last Year: Never true    Ran Out of Food in the Last Year: Never true  Transportation Needs: No Transportation Needs (06/27/2023)   Received from [facility] - Transportation    Lack of Transportation (Medical): No    Lack of Transportation (Non-Medical): No  Physical Activity: Sufficiently Active  (06/27/2023)   Received from [facility]   Exercise Vital Sign    Days of Exercise per Week: 5 days    Minutes of Exercise per Session: 40 min  Stress: Stress Concern Present (06/27/2023)   Received from [facility] - Occupational Stress Questionnaire    Feeling of Stress : To some extent  Social Connections: Socially Integrated (06/27/2023)   Received from [facility]   Social Network    How would you rate your social network (family, work, friends)?: Good participation with social networks  Intimate Partner Violence: Not At Risk (06/27/2023)   Received from [facility]   HITS    Over the last 12 months how often did your partner physically hurt you?: Never    Over the last 12 months how often did your partner insult you or talk down to you?: Never    Over the last 12 months how often did your partner threaten you with physical harm?: Never    Over the last 12 months how often did your partner scream or curse at you?: Never    Family History  Problem Relation Age of Onset   Heart disease Father    Heart attack Paternal Grandfather    Prostate cancer Paternal Grandfather    Breast cancer Paternal Grandmother    Review of Systems:  As stated in the HPI and otherwise negative.   BP 118/82   Pulse 100   Ht 6\' 4"  (1.93 m)   Wt 112.8 kg   SpO2 96%   BMI 30.26 kg/m   Physical Examination: General: Well developed, well nourished, NAD  HEENT: OP clear, mucus membranes moist  SKIN: warm, dry. No rashes. Neuro: No focal deficits  Musculoskeletal: Muscle strength 5/5 all ext  Psychiatric: Mood and affect normal  Neck: No JVD, no carotid bruits, no thyromegaly, no lymphadenopathy.  Lungs:Clear bilaterally, no wheezes, rhonci, crackles Cardiovascular: Regular rate and rhythm. No murmurs, gallops or rubs. Abdomen:Soft. Bowel sounds present. Non-tender.  Extremities: No lower extremity edema. Pulses are 2 + in the bilateral DP/PT.  EKG:   EKG is ordered today. The ekg ordered today demonstrates   Recent Labs: 03/18/2023: BUN 15; Creatinine, Ser 0.77; Hemoglobin 13.6; Platelets 212; Potassium 4.0; Sodium 139   Lipid Panel No results found for: "CHOL", "TRIG", "HDL", "CHOLHDL", "VLDL", "LDLCALC", "LDLDIRECT"   Wt Readings from Last 3 Encounters:  07/11/23 112.8 kg  06/05/23 112 kg  03/18/23 108.9 kg    Assessment and Plan:   1. Chest pain: Chest pain on a daily basis, mostly at rest. This occurs after meals. Strong FH of CAD with premature CAD in his father in his 39s. Will arrange a coronary CTA to exclude CAD.  2. HTN: BP controlled on Olmesartan/hydrochlorothiazide 40-25. No changes  3. Abnormal EKG: Possible LVH on EKG. History of HTN. Will arrange an echo to assess LV size and function.   Labs/ tests ordered today include:   Orders Placed This Encounter  Procedures   CT CORONARY MORPH W/CTA COR W/SCORE W/CA W/CM &/OR WO/CM   Basic Metabolic Panel (BMET)   ECHOCARDIOGRAM COMPLETE   Disposition:   F/U with me in 12 months  Signed, Verne Carrow, MD 07/11/2023 3:18 PM    [facility] [address] [place], [place], [place]  [address] Phone: [phone]; Fax: [phone]

## 2023-07-15 ENCOUNTER — Telehealth: Payer: Self-pay

## 2023-07-15 NOTE — Telephone Encounter (Signed)
 I spoke with the patient and advised him we are waiting on surgical clearance from his GP and Cardiologist. Once those come thru I will reach out to him to discuss surgery dates. Patient understands.

## 2023-07-15 NOTE — Telephone Encounter (Signed)
   Pre-operative Risk Assessment    Patient Name: Edward Stanley  DOB: 1980/08/03 MRN: [id number]   Date of last office visit: 07/11/23 Verne Carrow, MD Date of next office visit: NONE   Request for Surgical Clearance    Procedure:   L-5 S, REVISION FORAMINOTOMY  Date of Surgery:  Clearance TBD                                Surgeon:  Willia Craze, MD Surgeon's Group or Practice Name:  [facility] Phone number:  [phone] Fax number:  [phone] ATTN: APRIL B.   Type of Clearance Requested:   - Medical    Type of Anesthesia:  General    Additional requests/questions:    Signed, Marlow Baars   07/15/2023, 5:51 PM

## 2023-07-23 LAB — BASIC METABOLIC PANEL WITH GFR
BUN/Creatinine Ratio: 17 (ref 9–20)
BUN: 14 mg/dL (ref 6–24)
CO2: 24 mmol/L (ref 20–29)
Calcium: 9.9 mg/dL (ref 8.7–10.2)
Chloride: 100 mmol/L (ref 96–106)
Creatinine, Ser: 0.84 mg/dL (ref 0.76–1.27)
Glucose: 106 mg/dL — ABNORMAL HIGH (ref 70–99)
Potassium: 4.6 mmol/L (ref 3.5–5.2)
Sodium: 138 mmol/L (ref 134–144)
eGFR: 110 mL/min/{1.73_m2} (ref >59)

## 2023-07-24 ENCOUNTER — Encounter (HOSPITAL_COMMUNITY): Payer: Self-pay

## 2023-07-28 ENCOUNTER — Ambulatory Visit (HOSPITAL_COMMUNITY)
Admission: RE | Admit: 2023-07-28 | Discharge: 2023-07-28 | Disposition: A | Discharge status: Home / Self Care | Source: Ambulatory Visit | Attending: Cardiovascular Disease | Admitting: Cardiovascular Disease

## 2023-07-28 DIAGNOSIS — R079 Chest pain, unspecified: Secondary | ICD-10-CM | POA: Diagnosis present

## 2023-07-28 DIAGNOSIS — R072 Precordial pain: Secondary | ICD-10-CM

## 2023-07-28 MED ORDER — NITROGLYCERIN 0.4 MG SL SUBL
SUBLINGUAL_TABLET | SUBLINGUAL | Status: AC
  Filled 2023-07-28: qty 2

## 2023-07-28 MED ORDER — IOHEXOL 350 MG/ML SOLN
95.0000 mL | Freq: Once | INTRAVENOUS | Status: AC | PRN
  Administered 2023-07-28: 95 mL via INTRAVENOUS

## 2023-07-28 MED ORDER — NITROGLYCERIN 0.4 MG SL SUBL
0.8000 mg | SUBLINGUAL_TABLET | Freq: Once | SUBLINGUAL | Status: AC
  Administered 2023-07-28: 0.8 mg via SUBLINGUAL

## 2023-07-29 ENCOUNTER — Encounter: Payer: Self-pay | Admitting: Cardiovascular Disease

## 2023-07-30 ENCOUNTER — Other Ambulatory Visit: Payer: Self-pay

## 2023-07-30 ENCOUNTER — Ambulatory Visit: Payer: 59 | Admitting: Cardiovascular Disease

## 2023-08-01 MED ORDER — ROSUVASTATIN CALCIUM 10 MG PO TABS: 10.0000 mg | ORAL_TABLET | Freq: Every day | ORAL | 3 refills | Status: AC

## 2023-08-01 NOTE — Addendum Note (Signed)
 Addended by: Lendon Ka on: 08/01/2023 03:08 PM   Modules accepted: Orders

## 2023-08-05 ENCOUNTER — Other Ambulatory Visit: Payer: Self-pay | Admitting: *Deleted

## 2023-08-05 DIAGNOSIS — I2510 Atherosclerotic heart disease of native coronary artery without angina pectoris: Secondary | ICD-10-CM

## 2023-08-05 MED ORDER — ASPIRIN 81 MG PO TBEC: 81.0000 mg | DELAYED_RELEASE_TABLET | Freq: Every day | ORAL | Status: AC

## 2023-08-06 NOTE — Telephone Encounter (Signed)
 Clearance request is in the chart already see 06/2023

## 2023-08-11 ENCOUNTER — Ambulatory Visit (HOSPITAL_COMMUNITY): Attending: Cardiovascular Disease

## 2023-08-11 DIAGNOSIS — R9431 Abnormal electrocardiogram [ECG] [EKG]: Secondary | ICD-10-CM

## 2023-08-11 LAB — ECHOCARDIOGRAM COMPLETE
AR max vel: 3.87 cm2
AV Area VTI: 4.09 cm2
AV Area mean vel: 3.87 cm2
AV Mean grad: 4 mmHg
AV Peak grad: 7 mmHg
Ao pk vel: 1.32 m/s
Calc EF: 64.9 %
S' Lateral: 2.87 cm
Single Plane A2C EF: 69.5 %
Single Plane A4C EF: 61.1 %

## 2023-08-13 NOTE — Telephone Encounter (Signed)
 Still waiting on Cardiac clearance, but scheduled patient for surgery on 09/02/23.

## 2023-08-14 NOTE — Telephone Encounter (Signed)
     Primary Cardiologist: Antoinette Batman, MD  Chart reviewed as part of pre-operative protocol coverage. Given past medical history and time since last visit, based on ACC/AHA guidelines, Edward Stanley would be at acceptable risk for the planned procedure without further cardiovascular testing.   His recent coronary CTA showed minimal nonobstructive coronary disease.  Recent echocardiogram was reassuring.  His RCRI is low risk, 0.9% risk of major cardiac event.   I will route this recommendation to the requesting party via Epic fax function and remove from pre-op pool.  Please call with questions.  Edward Stanley. Edward Himes NP-C     08/14/2023, 9:40 AM [facility] [address] Office [phone] Fax [phone]

## 2023-08-21 ENCOUNTER — Encounter (HOSPITAL_COMMUNITY): Payer: Self-pay

## 2023-08-21 NOTE — Pre-Procedure Instructions (Signed)
 Surgical Instructions   Your procedure is scheduled on Tuesday, May 13th. Report to [facility] Main Entrance "A" at 11:00 A.M., then check in with the Admitting office. Any questions or running late day of surgery: call [phone]  Questions prior to your surgery date: call [phone], Monday-Friday, 8am-4pm. If you experience any cold or flu symptoms such as cough, fever, chills, shortness of breath, etc. between now and your scheduled surgery, please notify us  at the above number.     Remember:  Do not eat after midnight the night before your surgery  You may drink clear liquids until 10:00 AM the morning of your surgery.   Clear liquids allowed are: Water, Non-Citrus Juices (without pulp), Carbonated Beverages, Clear Tea (no milk, honey, etc.), Black Coffee Only (NO MILK, CREAM OR POWDERED CREAMER of any kind), and Gatorade.    Take these medicines the morning of surgery with A SIP OF WATER  omeprazole (PRILOSEC)  rosuvastatin  (CRESTOR )    Follow your surgeon's instructions on when to stop Aspirin .  If no instructions were given by your surgeon then you will need to call the office to get those instructions.    One week prior to surgery, STOP taking any Aleve, Naproxen, Ibuprofen, Motrin, Advil, Goody's, BC's, all herbal medications, fish oil, and non-prescription vitamins.                     Do NOT Smoke (Tobacco/Vaping) for 24 hours prior to your procedure.  If you use a CPAP at night, you may bring your mask/headgear for your overnight stay.   You will be asked to remove any contacts, glasses, piercing's, hearing aid's, dentures/partials prior to surgery. Please bring cases for these items if needed.    Patients discharged the day of surgery will not be allowed to drive home, and someone needs to stay with them for 24 hours.  SURGICAL WAITING ROOM VISITATION Patients may have no more than 2 support people in the waiting area - these visitors may rotate.   Pre-op nurse  will coordinate an appropriate time for 1 ADULT support person, who may not rotate, to accompany patient in pre-op.  Children under the age of 68 must have an adult with them who is not the patient and must remain in the main waiting area with an adult.  If the patient needs to stay at the hospital during part of their recovery, the visitor guidelines for inpatient rooms apply.  Please refer to the [facility] website for the visitor guidelines for any additional information.   If you received a COVID test during your pre-op visit  it is requested that you wear a mask when out in public, stay away from anyone that may not be feeling well and notify your surgeon if you develop symptoms. If you have been in contact with anyone that has tested positive in the last 10 days please notify you surgeon.      Pre-operative 5 CHG Bathing Instructions   You can play a key role in reducing the risk of infection after surgery. Your skin needs to be as free of germs as possible. You can reduce the number of germs on your skin by washing with CHG (chlorhexidine  gluconate) soap before surgery. CHG is an antiseptic soap that kills germs and continues to kill germs even after washing.   DO NOT use if you have an allergy to chlorhexidine /CHG or antibacterial soaps. If your skin becomes reddened or irritated, stop using the CHG and  notify one of our RNs at [phone].   Please shower with the CHG soap starting 4 days before surgery using the following schedule:     Please keep in mind the following:  DO NOT shave, including legs and underarms, starting the day of your first shower.   You may shave your face at any point before/day of surgery.  Place clean sheets on your bed the day you start using CHG soap. Use a clean washcloth (not used since being washed) for each shower. DO NOT sleep with pets once you start using the CHG.   CHG Shower Instructions:  Wash your face and private area with normal soap.  If you choose to wash your hair, wash first with your normal shampoo.  After you use shampoo/soap, rinse your hair and body thoroughly to remove shampoo/soap residue.  Turn the water OFF and apply about 3 tablespoons (45 ml) of CHG soap to a CLEAN washcloth.  Apply CHG soap ONLY FROM YOUR NECK DOWN TO YOUR TOES (washing for 3-5 minutes)  DO NOT use CHG soap on face, private areas, open wounds, or sores.  Pay special attention to the area where your surgery is being performed.  If you are having back surgery, having someone wash your back for you may be helpful. Wait 2 minutes after CHG soap is applied, then you may rinse off the CHG soap.  Pat dry with a clean towel  Put on clean clothes/pajamas   If you choose to wear lotion, please use ONLY the CHG-compatible lotions that are listed below.  Additional instructions for the day of surgery: DO NOT APPLY any lotions, deodorants, cologne, or perfumes.   Do not bring valuables to the hospital. [facility] is not responsible for any belongings/valuables. Do not wear nail polish, gel polish, artificial nails, or any other type of covering on natural nails (fingers and toes) Do not wear jewelry or makeup Put on clean/comfortable clothes.  Please brush your teeth.  Ask your nurse before applying any prescription medications to the skin.     CHG Compatible Lotions   Aveeno Moisturizing lotion  Cetaphil Moisturizing Cream  Cetaphil Moisturizing Lotion  Clairol Herbal Essence Moisturizing Lotion, Dry Skin  Clairol Herbal Essence Moisturizing Lotion, Extra Dry Skin  Clairol Herbal Essence Moisturizing Lotion, Normal Skin  Curel Age Defying Therapeutic Moisturizing Lotion with Alpha Hydroxy  Curel Extreme Care Body Lotion  Curel Soothing Hands Moisturizing Hand Lotion  Curel Therapeutic Moisturizing Cream, Fragrance-Free  Curel Therapeutic Moisturizing Lotion, Fragrance-Free  Curel Therapeutic Moisturizing Lotion, Original Formula  Eucerin  Daily Replenishing Lotion  Eucerin Dry Skin Therapy Plus Alpha Hydroxy Crme  Eucerin Dry Skin Therapy Plus Alpha Hydroxy Lotion  Eucerin Original Crme  Eucerin Original Lotion  Eucerin Plus Crme Eucerin Plus Lotion  Eucerin TriLipid Replenishing Lotion  Keri Anti-Bacterial Hand Lotion  Keri Deep Conditioning Original Lotion Dry Skin Formula Softly Scented  Keri Deep Conditioning Original Lotion, Fragrance Free Sensitive Skin Formula  Keri Lotion Fast Absorbing Fragrance Free Sensitive Skin Formula  Keri Lotion Fast Absorbing Softly Scented Dry Skin Formula  Keri Original Lotion  Keri Skin Renewal Lotion Keri Silky Smooth Lotion  Keri Silky Smooth Sensitive Skin Lotion  Nivea Body Creamy Conditioning Oil  Nivea Body Extra Enriched Teacher, adult education Moisturizing Lotion Nivea Crme  Nivea Skin Firming Lotion  NutraDerm 30 Skin Lotion  NutraDerm Skin Lotion  NutraDerm Therapeutic Skin Cream  NutraDerm Therapeutic Skin Lotion  ProShield Protective  Hand Cream  Provon moisturizing lotion  Please read over the following fact sheets that you were given.

## 2023-08-22 ENCOUNTER — Other Ambulatory Visit: Payer: Self-pay

## 2023-08-22 ENCOUNTER — Encounter (HOSPITAL_COMMUNITY)
Admission: RE | Admit: 2023-08-22 | Discharge: 2023-08-22 | Disposition: A | Discharge status: Home / Self Care | Source: Ambulatory Visit | Attending: Orthopedic Surgery | Admitting: Orthopedic Surgery

## 2023-08-22 ENCOUNTER — Encounter (HOSPITAL_COMMUNITY): Payer: Self-pay

## 2023-08-22 VITALS — BP 133/79 | HR 96 | Temp 98.2°F | Resp 19 | Ht 76.0 in | Wt 257.5 lb

## 2023-08-22 DIAGNOSIS — Z79899 Other long term (current) drug therapy: Secondary | ICD-10-CM | POA: Insufficient documentation

## 2023-08-22 DIAGNOSIS — Z01812 Encounter for preprocedural laboratory examination: Secondary | ICD-10-CM | POA: Diagnosis present

## 2023-08-22 DIAGNOSIS — I2510 Atherosclerotic heart disease of native coronary artery without angina pectoris: Secondary | ICD-10-CM | POA: Insufficient documentation

## 2023-08-22 DIAGNOSIS — I10 Essential (primary) hypertension: Secondary | ICD-10-CM | POA: Diagnosis not present

## 2023-08-22 DIAGNOSIS — K219 Gastro-esophageal reflux disease without esophagitis: Secondary | ICD-10-CM | POA: Insufficient documentation

## 2023-08-22 DIAGNOSIS — Z01818 Encounter for other preprocedural examination: Secondary | ICD-10-CM

## 2023-08-22 LAB — CBC
HCT: 39.7 % (ref 39.0–52.0)
Hemoglobin: 13.9 g/dL (ref 13.0–17.0)
MCH: 31 pg (ref 26.0–34.0)
MCHC: 35 g/dL (ref 30.0–36.0)
MCV: 88.4 fL (ref 80.0–100.0)
Platelets: 241 10*3/uL (ref 150–400)
RBC: 4.49 MIL/uL (ref 4.22–5.81)
RDW: 12.1 % (ref 11.5–15.5)
WBC: 5.3 10*3/uL (ref 4.0–10.5)
nRBC: 0 % (ref 0.0–0.2)

## 2023-08-22 LAB — BASIC METABOLIC PANEL WITH GFR
Anion gap: 10 (ref 5–15)
BUN: 16 mg/dL (ref 6–20)
CO2: 27 mmol/L (ref 22–32)
Calcium: 9.2 mg/dL (ref 8.9–10.3)
Chloride: 102 mmol/L (ref 98–111)
Creatinine, Ser: 0.9 mg/dL (ref 0.61–1.24)
GFR, Estimated: >60 mL/min (ref >60)
Glucose, Bld: 101 mg/dL — ABNORMAL HIGH (ref 70–99)
Potassium: 4.5 mmol/L (ref 3.5–5.1)
Sodium: 139 mmol/L (ref 135–145)

## 2023-08-22 LAB — SURGICAL PCR SCREEN
MRSA, PCR: NEGATIVE
Staphylococcus aureus: NEGATIVE

## 2023-08-22 NOTE — Progress Notes (Signed)
 PCP - Parnell Bologna Cardiologist - Dr. Antoinette Batman    Clearance 08/14/23  PPM/ICD - denies   Chest x-ray - denies EKG - 03/18/23 Stress Test - 12/31/13 ECHO - 08/11/23 Cardiac Cath - denies  Sleep Study - denies  No DM  Last dose of GLP1 agonist-  n/a GLP1 instructions:  n/a  Blood Thinner Instructions:  n/a Aspirin  Instructions:  patient was instructed to stop taking Aspirin  7 days prior to surgery  ERAS Protcol - clears until 1000 PRE-SURGERY Ensure or G2- Ensure as ordered  COVID TEST- n/a   Anesthesia review: yes - cardiac clearance  Patient denies shortness of breath, fever, cough and chest pain at PAT appointment   All instructions explained to the patient, with a verbal understanding of the material. Patient agrees to go over the instructions while at home for a better understanding. Patient also instructed to self quarantine after being tested for COVID-19. The opportunity to ask questions was provided.   Patient states that he was instructed to stop taking Metoprolol  after coming to the ED for chest pain (reflux).  He stated that his heart rate was low at that time.  Patient's HR was 96 on at PAT appointment and stated that his heart rate has been in the upper 90's.  He informed nurse that he was going to reach out to Dr. Jeneal Mins office for follow-up about his elevated HR and question if he needs to go back on Metoprolol .  Patient states that if he does restart Metoprolol , he will take it at night as he previously did but patient is aware that if he takes in the morning, he will need to take it on the morning of surgery.

## 2023-08-22 NOTE — Progress Notes (Signed)
 Surgical Instructions     Your procedure is scheduled on Tuesday, May 13th. Report to [facility] Main Entrance "A" at 11:00 A.M., then check in with the Admitting office. Any questions or running late day of surgery: call [phone]   Questions prior to your surgery date: call [phone], Monday-Friday, 8am-4pm. If you experience any cold or flu symptoms such as cough, fever, chills, shortness of breath, etc. between now and your scheduled surgery, please notify us  at the above number.            Remember:       Do not eat after midnight the night before your surgery   You may drink clear liquids until 10:00 AM the morning of your surgery.   Clear liquids allowed are: Water, Non-Citrus Juices (without pulp), Carbonated Beverages, Clear Tea (no milk, honey, etc.), Black Coffee Only (NO MILK, CREAM OR POWDERED CREAMER of any kind), and Gatorade.  Patient Instructions  The night before surgery:  No food after midnight. ONLY clear liquids after midnight  The day of surgery (if you do NOT have diabetes):  Drink ONE (1) Pre-Surgery Clear Ensure by 10:00 the morning of surgery. Drink in one sitting. Do not sip.  This drink was given to you during your hospital  pre-op appointment visit.  Nothing else to drink after completing the  Pre-Surgery Clear Ensure.          If you have questions, please contact your surgeon's office.           Take these medicines the morning of surgery with A SIP OF WATER  omeprazole (PRILOSEC)  rosuvastatin  (CRESTOR )      Follow your surgeon's instructions on when to stop Aspirin .  If no instructions were given by your surgeon then you will need to call the office to get those instructions.     One week prior to surgery, STOP taking any Aleve, Naproxen, Ibuprofen, Motrin, Advil, Goody's, BC's, all herbal medications, fish oil, and non-prescription vitamins.                     Do NOT Smoke (Tobacco/Vaping) for 24 hours prior to your procedure.    If you use a CPAP at night, you may bring your mask/headgear for your overnight stay.   You will be asked to remove any contacts, glasses, piercing's, hearing aid's, dentures/partials prior to surgery. Please bring cases for these items if needed.    Patients discharged the day of surgery will not be allowed to drive home, and someone needs to stay with them for 24 hours.   SURGICAL WAITING ROOM VISITATION Patients may have no more than 2 support people in the waiting area - these visitors may rotate.   Pre-op nurse will coordinate an appropriate time for 1 ADULT support person, who may not rotate, to accompany patient in pre-op.  Children under the age of 27 must have an adult with them who is not the patient and must remain in the main waiting area with an adult.   If the patient needs to stay at the hospital during part of their recovery, the visitor guidelines for inpatient rooms apply.   Please refer to the [facility] website for the visitor guidelines for any additional information.     If you received a COVID test during your pre-op visit  it is requested that you wear a mask when out in public, stay away from anyone that may not be feeling well and notify your  surgeon if you develop symptoms. If you have been in contact with anyone that has tested positive in the last 10 days please notify you surgeon.         Pre-operative 5 CHG Bathing Instructions    You can play a key role in reducing the risk of infection after surgery. Your skin needs to be as free of germs as possible. You can reduce the number of germs on your skin by washing with CHG (chlorhexidine  gluconate) soap before surgery. CHG is an antiseptic soap that kills germs and continues to kill germs even after washing.    DO NOT use if you have an allergy to chlorhexidine /CHG or antibacterial soaps. If your skin becomes reddened or irritated, stop using the CHG and notify one of our RNs at [phone].    Please shower  with the CHG soap starting 4 days before surgery using the following schedule:       Please keep in mind the following:  DO NOT shave, including legs and underarms, starting the day of your first shower.   You may shave your face at any point before/day of surgery.  Place clean sheets on your bed the day you start using CHG soap. Use a clean washcloth (not used since being washed) for each shower. DO NOT sleep with pets once you start using the CHG.    CHG Shower Instructions:  Wash your face and private area with normal soap. If you choose to wash your hair, wash first with your normal shampoo.  After you use shampoo/soap, rinse your hair and body thoroughly to remove shampoo/soap residue.  Turn the water OFF and apply about 3 tablespoons (45 ml) of CHG soap to a CLEAN washcloth.  Apply CHG soap ONLY FROM YOUR NECK DOWN TO YOUR TOES (washing for 3-5 minutes)  DO NOT use CHG soap on face, private areas, open wounds, or sores.  Pay special attention to the area where your surgery is being performed.  If you are having back surgery, having someone wash your back for you may be helpful. Wait 2 minutes after CHG soap is applied, then you may rinse off the CHG soap.  Pat dry with a clean towel  Put on clean clothes/pajamas   If you choose to wear lotion, please use ONLY the CHG-compatible lotions that are listed below.   Additional instructions for the day of surgery: DO NOT APPLY any lotions, deodorants, cologne, or perfumes.   Do not bring valuables to the hospital. [facility] is not responsible for any belongings/valuables. Do not wear nail polish, gel polish, artificial nails, or any other type of covering on natural nails (fingers and toes) Do not wear jewelry or makeup Put on clean/comfortable clothes.  Please brush your teeth.  Ask your nurse before applying any prescription medications to the skin.        CHG Compatible Lotions    Aveeno Moisturizing lotion  Cetaphil  Moisturizing Cream  Cetaphil Moisturizing Lotion  Clairol Herbal Essence Moisturizing Lotion, Dry Skin  Clairol Herbal Essence Moisturizing Lotion, Extra Dry Skin  Clairol Herbal Essence Moisturizing Lotion, Normal Skin  Curel Age Defying Therapeutic Moisturizing Lotion with Alpha Hydroxy  Curel Extreme Care Body Lotion  Curel Soothing Hands Moisturizing Hand Lotion  Curel Therapeutic Moisturizing Cream, Fragrance-Free  Curel Therapeutic Moisturizing Lotion, Fragrance-Free  Curel Therapeutic Moisturizing Lotion, Original Formula  Eucerin Daily Replenishing Lotion  Eucerin Dry Skin Therapy Plus Alpha Hydroxy Crme  Eucerin Dry Skin Therapy Plus Alpha Hydroxy Lotion  Eucerin Original Crme  Eucerin Original Lotion  Eucerin Plus Crme Eucerin Plus Lotion  Eucerin TriLipid Replenishing Lotion  Keri Anti-Bacterial Hand Lotion  Keri Deep Conditioning Original Lotion Dry Skin Formula Softly Scented  Keri Deep Conditioning Original Lotion, Fragrance Free Sensitive Skin Formula  Keri Lotion Fast Absorbing Fragrance Free Sensitive Skin Formula  Keri Lotion Fast Absorbing Softly Scented Dry Skin Formula  Keri Original Lotion  Keri Skin Renewal Lotion Keri Silky Smooth Lotion  Keri Silky Smooth Sensitive Skin Lotion  Nivea Body Creamy Conditioning Oil  Nivea Body Extra Enriched Lotion  Nivea Body Original Lotion  Nivea Body Sheer Moisturizing Lotion Nivea Crme  Nivea Skin Firming Lotion  NutraDerm 30 Skin Lotion  NutraDerm Skin Lotion  NutraDerm Therapeutic Skin Cream  NutraDerm Therapeutic Skin Lotion  ProShield Protective Hand Cream  Provon moisturizing lotion   Please read over the following fact sheets that you were given.

## 2023-08-25 NOTE — Anesthesia Preprocedure Evaluation (Addendum)
 Anesthesia Evaluation  Patient identified by MRN, date of birth, ID band Patient awake    Reviewed: Allergy & Precautions, NPO status , Patient's Chart, lab work & pertinent test results, reviewed documented beta blocker date and time   History of Anesthesia Complications Negative for: history of anesthetic complications  Airway Mallampati: I  TM Distance: >3 FB Neck ROM: Full    Dental  (+) Dental Advisory Given   Pulmonary neg pulmonary ROS   breath sounds clear to auscultation       Cardiovascular hypertension, Pt. on medications and Pt. on home beta blockers (-) angina  Rhythm:Regular Rate:Normal  07/2023 ECHO: EF 60 to 65%. 1.  The LV has normal function, no regional wall motion abnormalities. Left ventricular diastolic parameters were normal.   2. RVF is normal. The right ventricular size is normal. Tricuspid regurgitation signal is inadequate for assessing PA pressure.   3. The mitral valve is grossly normal. Trivial mitral valve regurgitation. No evidence of mitral stenosis.   4. The aortic valve is tricuspid. Aortic valve regurgitation is not visualized. No aortic stenosi     Neuro/Psych Back pain/leg numbness    GI/Hepatic Neg liver ROS,GERD  Medicated and Controlled,,  Endo/Other  BMI 30  Renal/GU negative Renal ROS     Musculoskeletal   Abdominal   Peds  Hematology Hb 13.9, plt 241k   Anesthesia Other Findings   Reproductive/Obstetrics                             Anesthesia Physical Anesthesia Plan  ASA: 2  Anesthesia Plan: General   Post-op Pain Management: Tylenol  PO (pre-op)*   Induction: Intravenous  PONV Risk Score and Plan: 2 and Ondansetron  and Dexamethasone   Airway Management Planned: Oral ETT  Additional Equipment: None  Intra-op Plan:   Post-operative Plan: Extubation in OR  Informed Consent: I have reviewed the patients History and Physical, chart,  labs and discussed the procedure including the risks, benefits and alternatives for the proposed anesthesia with the patient or authorized representative who has indicated his/her understanding and acceptance.     Dental advisory given  Plan Discussed with: CRNA and Surgeon  Anesthesia Plan Comments: (PAT note by Rudy Costain, PA-C: 43 year old male with pertinent history including HTN, GERD on PPI.  Recently evaluated by cardiology for complaint of chest pain, reportedly happening daily after meals.  Coronary CTA 07/2023 showed minimal CAD.  TTE 08/11/2023 showed EF 60 to 65%, normal RV function, no significant valvular abnormalities, mild dilatation of the aortic root (40 mm).  Preop labs reviewed, WNL.  EKG 03/18/2023: Sinus bradycardia.  Rate 40. Prolonged PR interval. Probable left ventricular hypertrophy  TTE 08/11/2023: 1. Left ventricular ejection fraction, by estimation, is 60 to 65%. The  left ventricle has normal function. The left ventricle has no regional  wall motion abnormalities. Left ventricular diastolic parameters were  normal.  2. Right ventricular systolic function is normal. The right ventricular  size is normal. Tricuspid regurgitation signal is inadequate for assessing  PA pressure.  3. The mitral valve is grossly normal. Trivial mitral valve  regurgitation. No evidence of mitral stenosis.  4. The aortic valve is tricuspid. Aortic valve regurgitation is not  visualized. No aortic stenosis is present.  5. Aortic dilatation noted. There is mild dilatation of the aortic root,  measuring 40 mm.  6. The inferior vena cava is normal in size with greater than 50%  respiratory variability, suggesting right  atrial pressure of 3 mmHg.   Conclusion(s)/Recommendation(s): Normal biventricular function without  evidence of hemodynamically significant valvular heart disease.   Coronary CTA 07/28/2023: IMPRESSION: 1. Mnimal CAD, <25% stenosis, CADRADS 1.  2. Total  plaque volume 90 mm3 which is 71st percentile for age- and sex-matched controls (calcified plaque 7 mm3; non-calcified plaque 83 mm3). TPV is mild.  3. Coronary calcium  score is 16.1, which places the patient in the 85th percentile for age and sex matched control.  4. Normal coronary origins with right dominance.  5. 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  RECOMMENDATIONS: CAD-RADS 1. Minimal non-obstructive CAD (0-24%). Consider non-atherosclerotic causes of chest pain. Consider preventive therapy and risk factor modification.   )        Anesthesia Quick Evaluation

## 2023-08-25 NOTE — Progress Notes (Signed)
 Anesthesia Chart Review:  43 year old male with pertinent history including HTN, GERD on PPI.  Recently evaluated by cardiology for complaint of chest pain, reportedly happening daily after meals.  Coronary CTA 07/2023 showed minimal CAD.  TTE 08/11/2023 showed EF 60 to 65%, normal RV function, no significant valvular abnormalities, mild dilatation of the aortic root (40 mm).  Preop labs reviewed, WNL.  EKG 03/18/2023: Sinus bradycardia.  Rate 40. Prolonged PR interval. Probable left ventricular hypertrophy  TTE 08/11/2023: 1. Left ventricular ejection fraction, by estimation, is 60 to 65%. The  left ventricle has normal function. The left ventricle has no regional  wall motion abnormalities. Left ventricular diastolic parameters were  normal.   2. Right ventricular systolic function is normal. The right ventricular  size is normal. Tricuspid regurgitation signal is inadequate for assessing  PA pressure.   3. The mitral valve is grossly normal. Trivial mitral valve  regurgitation. No evidence of mitral stenosis.   4. The aortic valve is tricuspid. Aortic valve regurgitation is not  visualized. No aortic stenosis is present.   5. Aortic dilatation noted. There is mild dilatation of the aortic root,  measuring 40 mm.   6. The inferior vena cava is normal in size with greater than 50%  respiratory variability, suggesting right atrial pressure of 3 mmHg.   Conclusion(s)/Recommendation(s): Normal biventricular function without  evidence of hemodynamically significant valvular heart disease.   Coronary CTA 07/28/2023: IMPRESSION: 1. Mnimal CAD, <25% stenosis, CADRADS 1.   2. Total plaque volume 90 mm3 which is 71st percentile for age- and sex-matched controls (calcified plaque 7 mm3; non-calcified plaque 83 mm3). TPV is mild.   3. Coronary calcium  score is 16.1, which places the patient in the 85th percentile for age and sex matched control.   4. Normal coronary origins with right  dominance.   5. 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   RECOMMENDATIONS: CAD-RADS 1. Minimal non-obstructive CAD (0-24%). Consider non-atherosclerotic causes of chest pain. Consider preventive therapy and risk factor modification.    Edilia Gordon [facility] Short Stay Center/Anesthesiology Phone [phone] 08/25/2023 3:58 PM

## 2023-08-28 MED ORDER — METOPROLOL SUCCINATE ER 25 MG PO TB24: 25.0000 mg | ORAL_TABLET | Freq: Every day | ORAL | 3 refills | Status: AC

## 2023-09-02 ENCOUNTER — Encounter (HOSPITAL_COMMUNITY)
Admission: RE | Disposition: A | Discharge status: Home / Self Care | Payer: Self-pay | Source: Home / Self Care | Attending: Orthopedic Surgery

## 2023-09-02 ENCOUNTER — Ambulatory Visit (HOSPITAL_COMMUNITY): Payer: Self-pay | Admitting: Physician Assistant

## 2023-09-02 ENCOUNTER — Encounter (HOSPITAL_COMMUNITY): Payer: Self-pay | Admitting: Orthopedic Surgery

## 2023-09-02 ENCOUNTER — Ambulatory Visit (HOSPITAL_COMMUNITY)
Admission: RE | Admit: 2023-09-02 | Discharge: 2023-09-02 | Disposition: A | Discharge status: Home / Self Care | Attending: Orthopedic Surgery | Admitting: Orthopedic Surgery

## 2023-09-02 ENCOUNTER — Other Ambulatory Visit: Payer: Self-pay

## 2023-09-02 ENCOUNTER — Ambulatory Visit (HOSPITAL_BASED_OUTPATIENT_CLINIC_OR_DEPARTMENT_OTHER)

## 2023-09-02 ENCOUNTER — Ambulatory Visit (HOSPITAL_COMMUNITY)

## 2023-09-02 DIAGNOSIS — M5416 Radiculopathy, lumbar region: Secondary | ICD-10-CM

## 2023-09-02 DIAGNOSIS — I10 Essential (primary) hypertension: Secondary | ICD-10-CM | POA: Diagnosis not present

## 2023-09-02 DIAGNOSIS — K219 Gastro-esophageal reflux disease without esophagitis: Secondary | ICD-10-CM | POA: Insufficient documentation

## 2023-09-02 DIAGNOSIS — Z01818 Encounter for other preprocedural examination: Secondary | ICD-10-CM

## 2023-09-02 HISTORY — PX: DECOMPRESSIVE LUMBAR LAMINECTOMY LEVEL 1: SHX5791

## 2023-09-02 SURGERY — DECOMPRESSIVE LUMBAR LAMINECTOMY LEVEL 1
Anesthesia: General | Site: Spine Lumbar

## 2023-09-02 MED ORDER — VANCOMYCIN HCL 1000 MG IV SOLR
INTRAVENOUS | Status: AC
  Filled 2023-09-02: qty 20

## 2023-09-02 MED ORDER — HYDROMORPHONE HCL 1 MG/ML IJ SOLN
0.2500 mg | INTRAMUSCULAR | Status: DC | PRN
  Administered 2023-09-02: 0.5 mg via INTRAVENOUS

## 2023-09-02 MED ORDER — ACETAMINOPHEN 500 MG PO TABS
1000.0000 mg | ORAL_TABLET | Freq: Three times a day (TID) | ORAL | 0 refills | Status: AC
Start: 2023-09-02 — End: 2023-09-16

## 2023-09-02 MED ORDER — HYDROMORPHONE HCL 1 MG/ML IJ SOLN
INTRAMUSCULAR | Status: AC
  Filled 2023-09-02: qty 0.5

## 2023-09-02 MED ORDER — HYDROMORPHONE HCL 1 MG/ML IJ SOLN
INTRAMUSCULAR | Status: AC
  Filled 2023-09-02: qty 1

## 2023-09-02 MED ORDER — ONDANSETRON HCL 4 MG/2ML IJ SOLN
INTRAMUSCULAR | Status: AC
  Filled 2023-09-02: qty 2

## 2023-09-02 MED ORDER — OXYCODONE HCL 5 MG/5ML PO SOLN: 5.0000 mg | Freq: Once | ORAL | Status: DC | PRN

## 2023-09-02 MED ORDER — LABETALOL HCL 5 MG/ML IV SOLN
INTRAVENOUS | Status: DC | PRN
  Administered 2023-09-02: 10 mg via INTRAVENOUS

## 2023-09-02 MED ORDER — MIDAZOLAM HCL 2 MG/2ML IJ SOLN
INTRAMUSCULAR | Status: AC
  Filled 2023-09-02: qty 2

## 2023-09-02 MED ORDER — OXYCODONE HCL 5 MG PO TABS: 5.0000 mg | ORAL_TABLET | ORAL | 0 refills | Status: AC | PRN

## 2023-09-02 MED ORDER — POVIDONE-IODINE 10 % EX SWAB
2.0000 | Freq: Once | CUTANEOUS | Status: AC
  Administered 2023-09-02: 2 via TOPICAL

## 2023-09-02 MED ORDER — DEXAMETHASONE SODIUM PHOSPHATE 10 MG/ML IJ SOLN
10.0000 mg | Freq: Once | INTRAMUSCULAR | Status: AC
  Administered 2023-09-02: 10 mg via INTRAVENOUS
  Filled 2023-09-02: qty 1

## 2023-09-02 MED ORDER — ACETAMINOPHEN 10 MG/ML IV SOLN
INTRAVENOUS | Status: AC
Start: 2023-09-02 — End: ?
  Filled 2023-09-02: qty 100

## 2023-09-02 MED ORDER — LABETALOL HCL 5 MG/ML IV SOLN
INTRAVENOUS | Status: AC
  Filled 2023-09-02: qty 4

## 2023-09-02 MED ORDER — KETOROLAC TROMETHAMINE 30 MG/ML IJ SOLN
INTRAMUSCULAR | Status: AC
  Filled 2023-09-02: qty 1

## 2023-09-02 MED ORDER — ONDANSETRON HCL 4 MG/2ML IJ SOLN
INTRAMUSCULAR | Status: DC | PRN
  Administered 2023-09-02: 4 mg via INTRAVENOUS

## 2023-09-02 MED ORDER — SUGAMMADEX SODIUM 200 MG/2ML IV SOLN
INTRAVENOUS | Status: DC | PRN
Start: 2023-09-02 — End: 2023-09-02
  Administered 2023-09-02: 226.8 mg via INTRAVENOUS

## 2023-09-02 MED ORDER — FENTANYL CITRATE (PF) 250 MCG/5ML IJ SOLN
INTRAMUSCULAR | Status: AC
  Filled 2023-09-02: qty 5

## 2023-09-02 MED ORDER — PROPOFOL 10 MG/ML IV BOLUS
INTRAVENOUS | Status: DC | PRN
  Administered 2023-09-02: 200 mg via INTRAVENOUS

## 2023-09-02 MED ORDER — TRANEXAMIC ACID-NACL 1000-0.7 MG/100ML-% IV SOLN
1000.0000 mg | INTRAVENOUS | Status: AC
  Administered 2023-09-02: 1000 mg via INTRAVENOUS
  Filled 2023-09-02: qty 100

## 2023-09-02 MED ORDER — LACTATED RINGERS IV SOLN: INTRAVENOUS | Status: DC

## 2023-09-02 MED ORDER — POLYETHYLENE GLYCOL 3350 17 G PO PACK
17.0000 g | PACK | Freq: Every day | ORAL | 0 refills | Status: AC
Start: 2023-09-02 — End: 2023-09-16

## 2023-09-02 MED ORDER — MEPERIDINE HCL 25 MG/ML IJ SOLN: 6.2500 mg | INTRAMUSCULAR | Status: DC | PRN

## 2023-09-02 MED ORDER — SENNA 8.6 MG PO TABS
1.0000 | ORAL_TABLET | Freq: Two times a day (BID) | ORAL | 0 refills | Status: AC
Start: 2023-09-02 — End: 2023-09-16

## 2023-09-02 MED ORDER — ALBUMIN HUMAN 5 % IV SOLN: INTRAVENOUS | Status: DC | PRN

## 2023-09-02 MED ORDER — ROCURONIUM BROMIDE 10 MG/ML (PF) SYRINGE
PREFILLED_SYRINGE | INTRAVENOUS | Status: DC | PRN
  Administered 2023-09-02: 70 mg via INTRAVENOUS
  Administered 2023-09-02: 30 mg via INTRAVENOUS
  Administered 2023-09-02: 20 mg via INTRAVENOUS
  Administered 2023-09-02: 30 mg via INTRAVENOUS
  Administered 2023-09-02: 20 mg via INTRAVENOUS
  Administered 2023-09-02: 10 mg via INTRAVENOUS
  Administered 2023-09-02 (×2): 30 mg via INTRAVENOUS
  Administered 2023-09-02: 20 mg via INTRAVENOUS

## 2023-09-02 MED ORDER — MIDAZOLAM HCL 2 MG/2ML IJ SOLN
INTRAMUSCULAR | Status: DC | PRN
  Administered 2023-09-02: 2 mg via INTRAVENOUS

## 2023-09-02 MED ORDER — ORAL CARE MOUTH RINSE: 15.0000 mL | Freq: Once | OROMUCOSAL | Status: AC

## 2023-09-02 MED ORDER — HYDROMORPHONE HCL 1 MG/ML IJ SOLN
INTRAMUSCULAR | Status: DC | PRN
  Administered 2023-09-02 (×2): .5 mg via INTRAVENOUS

## 2023-09-02 MED ORDER — 0.9 % SODIUM CHLORIDE (POUR BTL) OPTIME
TOPICAL | Status: DC | PRN
  Administered 2023-09-02: 1000 mL

## 2023-09-02 MED ORDER — CHLORHEXIDINE GLUCONATE 0.12 % MT SOLN
15.0000 mL | Freq: Once | OROMUCOSAL | Status: AC
  Administered 2023-09-02: 15 mL via OROMUCOSAL
  Filled 2023-09-02: qty 15

## 2023-09-02 MED ORDER — METHOCARBAMOL 750 MG PO TABS
750.0000 mg | ORAL_TABLET | Freq: Four times a day (QID) | ORAL | 0 refills | Status: AC
Start: 2023-09-02 — End: 2023-09-12

## 2023-09-02 MED ORDER — SODIUM CHLORIDE 0.9 % IV SOLN: INTRAVENOUS | Status: DC | PRN

## 2023-09-02 MED ORDER — MIDAZOLAM HCL 2 MG/2ML IJ SOLN: 0.5000 mg | Freq: Once | INTRAMUSCULAR | Status: DC | PRN

## 2023-09-02 MED ORDER — CEFAZOLIN SODIUM-DEXTROSE 2-4 GM/100ML-% IV SOLN
2.0000 g | INTRAVENOUS | Status: AC
  Administered 2023-09-02: 2 g via INTRAVENOUS
  Filled 2023-09-02: qty 100

## 2023-09-02 MED ORDER — ACETAMINOPHEN 10 MG/ML IV SOLN
1000.0000 mg | Freq: Once | INTRAVENOUS | Status: AC
  Administered 2023-09-02: 1000 mg via INTRAVENOUS

## 2023-09-02 MED ORDER — ROCURONIUM BROMIDE 10 MG/ML (PF) SYRINGE
PREFILLED_SYRINGE | INTRAVENOUS | Status: AC
  Filled 2023-09-02: qty 10

## 2023-09-02 MED ORDER — LIDOCAINE 2% (20 MG/ML) 5 ML SYRINGE
INTRAMUSCULAR | Status: AC
  Filled 2023-09-02: qty 5

## 2023-09-02 MED ORDER — FENTANYL CITRATE (PF) 250 MCG/5ML IJ SOLN
INTRAMUSCULAR | Status: DC | PRN
  Administered 2023-09-02 (×2): 50 ug via INTRAVENOUS
  Administered 2023-09-02: 150 ug via INTRAVENOUS
  Administered 2023-09-02: 50 ug via INTRAVENOUS

## 2023-09-02 MED ORDER — PROPOFOL 10 MG/ML IV BOLUS
INTRAVENOUS | Status: AC
  Filled 2023-09-02: qty 20

## 2023-09-02 MED ORDER — KETOROLAC TROMETHAMINE 30 MG/ML IJ SOLN
INTRAMUSCULAR | Status: DC | PRN
  Administered 2023-09-02: 30 mg via INTRAVENOUS

## 2023-09-02 MED ORDER — ROCURONIUM BROMIDE 10 MG/ML (PF) SYRINGE
PREFILLED_SYRINGE | INTRAVENOUS | Status: AC
  Filled 2023-09-02: qty 20

## 2023-09-02 MED ORDER — PHENYLEPHRINE 80 MCG/ML (10ML) SYRINGE FOR IV PUSH (FOR BLOOD PRESSURE SUPPORT)
PREFILLED_SYRINGE | INTRAVENOUS | Status: AC
  Filled 2023-09-02: qty 10

## 2023-09-02 MED ORDER — OXYCODONE HCL 5 MG PO TABS: 5.0000 mg | ORAL_TABLET | Freq: Once | ORAL | Status: DC | PRN

## 2023-09-02 MED ORDER — VANCOMYCIN HCL 1000 MG IV SOLR
INTRAVENOUS | Status: DC | PRN
Start: 2023-09-02 — End: 2023-09-02
  Administered 2023-09-02: 1000 mg via TOPICAL

## 2023-09-02 MED ORDER — LIDOCAINE 2% (20 MG/ML) 5 ML SYRINGE
INTRAMUSCULAR | Status: DC | PRN
Start: 2023-09-02 — End: 2023-09-02
  Administered 2023-09-02: 100 mg via INTRAVENOUS

## 2023-09-02 MED ORDER — DEXAMETHASONE SODIUM PHOSPHATE 10 MG/ML IJ SOLN
INTRAMUSCULAR | Status: AC
  Filled 2023-09-02: qty 1

## 2023-09-02 MED ORDER — DEXMEDETOMIDINE HCL IN NACL 80 MCG/20ML IV SOLN
INTRAVENOUS | Status: DC | PRN
  Administered 2023-09-02 (×2): 8 ug via INTRAVENOUS
  Administered 2023-09-02: 12 ug via INTRAVENOUS
  Administered 2023-09-02: 8 ug via INTRAVENOUS
  Administered 2023-09-02: 4 ug via INTRAVENOUS

## 2023-09-02 MED ORDER — ACETAMINOPHEN 500 MG PO TABS
1000.0000 mg | ORAL_TABLET | Freq: Once | ORAL | Status: AC
  Administered 2023-09-02: 1000 mg via ORAL
  Filled 2023-09-02: qty 2

## 2023-09-02 SURGICAL SUPPLY — 40 items
BENZOIN TINCTURE AMPULE (MISCELLANEOUS) ×1 IMPLANT
BENZOIN TINCTURE PRP APPL 2/3 (GAUZE/BANDAGES/DRESSINGS) ×1 IMPLANT
BUR MATCHSTICK NEURO 3.0 LAGG (BURR) ×1 IMPLANT
CANISTER SUCTION 3000ML PPV (SUCTIONS) ×1 IMPLANT
CANNULA FEM VENOUS REMOTE 22FR (CANNULA) IMPLANT
COVER MAYO STAND STRL (DRAPES) ×3 IMPLANT
COVER SURGICAL LIGHT HANDLE (MISCELLANEOUS) ×1 IMPLANT
DERMABOND ADVANCED .7 DNX12 (GAUZE/BANDAGES/DRESSINGS) IMPLANT
DRAIN HEMOVAC 7FR (DRAIN) IMPLANT
DRAPE C-ARM 42X72 X-RAY (DRAPES) ×1 IMPLANT
DRAPE UTILITY XL STRL (DRAPES) ×2 IMPLANT
DRESSING MEPILEX FLEX 4X4 (GAUZE/BANDAGES/DRESSINGS) ×1 IMPLANT
DRSG MEPILEX POST OP 4X8 (GAUZE/BANDAGES/DRESSINGS) ×1 IMPLANT
DRSG TEGADERM 4X4.75 (GAUZE/BANDAGES/DRESSINGS) ×3 IMPLANT
DURAPREP 26ML APPLICATOR (WOUND CARE) ×1 IMPLANT
ELECT PENCIL ROCKER SW 15FT (MISCELLANEOUS) ×1 IMPLANT
ELECTRODE REM PT RTRN 9FT ADLT (ELECTROSURGICAL) ×1 IMPLANT
GAUZE SPONGE 4X4 12PLY STRL (GAUZE/BANDAGES/DRESSINGS) ×1 IMPLANT
GLOVE BIO SURGEON STRL SZ7.5 (GLOVE) ×1 IMPLANT
GLOVE INDICATOR 7.5 STRL GRN (GLOVE) ×1 IMPLANT
GOWN STRL SURGICAL XL XLNG (GOWN DISPOSABLE) ×1 IMPLANT
KIT BASIN OR (CUSTOM PROCEDURE TRAY) ×1 IMPLANT
KIT POSITION SURG JACKSON T1 (MISCELLANEOUS) ×1 IMPLANT
KIT TURNOVER KIT B (KITS) ×1 IMPLANT
NS IRRIG 1000ML POUR BTL (IV SOLUTION) ×1 IMPLANT
PACK LAMINECTOMY ORTHO (CUSTOM PROCEDURE TRAY) ×1 IMPLANT
PATTIES SURGICAL .5 X.5 (GAUZE/BANDAGES/DRESSINGS) IMPLANT
SPONGE SURGIFOAM ABS GEL 100 (HEMOSTASIS) ×1 IMPLANT
SUCTION TUBE FRAZIER 10FR DISP (SUCTIONS) ×1 IMPLANT
SUT BONE WAX W31G (SUTURE) ×1 IMPLANT
SUT MNCRL AB 3-0 PS2 27 (SUTURE) ×1 IMPLANT
SUT MNCRL+ AB 3-0 CT1 36 (SUTURE) ×1 IMPLANT
SUT VIC AB 0 CT1 18XCR BRD8 (SUTURE) ×1 IMPLANT
SUT VIC AB 2-0 CT1 18 (SUTURE) ×1 IMPLANT
SUT VIC AB 2-0 CT2 18 VCP726D (SUTURE) ×1 IMPLANT
TOWEL GREEN STERILE (TOWEL DISPOSABLE) ×1 IMPLANT
TOWEL GREEN STERILE FF (TOWEL DISPOSABLE) ×1 IMPLANT
TRAY FOLEY MTR SLVR 16FR STAT (SET/KITS/TRAYS/PACK) IMPLANT
TUBING FEATHERFLOW (TUBING) ×1 IMPLANT
WATER STERILE IRR 1000ML POUR (IV SOLUTION) ×1 IMPLANT

## 2023-09-02 NOTE — Op Note (Signed)
 Orthopedic Spine Surgery Operative Report  Procedure: Revision right L5/S1 foraminotomy  Modifier: none  Date of procedure: 09/02/2023  Patient name: Edward Stanley MRN: [id number] DOB: 1981-04-11  Surgeon: Colette Davies, MD Assistant: none Pre-operative diagnosis: lumbar radiculopathy Post-operative diagnosis: same as above Findings: hemilaminotomy defects on the left and right at L5/S1  Specimens: none Anesthesia: general EBL: 200cc Complications: none Pre-incision antibiotic: ancef  TXA given prior to incision as well  Implants: none   Indication for procedure: Patient is a 43 y.o. male who presented to the office with symptoms consistent with lumbar radiculopathy. The patient had tried conservative treatments that did not provide any lasting relief. As result, operative management was discussed. The pre-operative MRI showed L5/S1 foraminal stenosis so a revision right L5/S1 foraminotomy was presented as a treatment option. The risks including but not limited to iatrogenic instability, dural tear, nerve root injury, paralysis, persistent pain, infection, bleeding, heart attack, death, dvt/pe, fracture, and need for additional procedures were discussed with the patient. The benefit of the surgery would be relief of the patient's right leg numbness and giving out. The alternatives to surgical management were covered with the patient and included continued monitoring, physical therapy, over-the-counter pain medications, ambulatory aids, injections, and activity modification. All the patient's questions were answered to his satisfaction. After this discussion, the patient expressed understanding and elected to proceed with surgical intervention.   Procedure Description: The patient was met in the pre-operative holding area. The patient's identity and consent were verified. The operative site was marked. The patient's remaining questions about the surgery were answered. The patient was brought  back to the operating room. General anesthesia was induced and an endotracheal tube was placed by the anesthesia staff. The patient was transferred to the prone [place] table in the prone position. All bony prominences were well padded. The head of the bed was slightly elevated and the eyes were free from compression by the face pillow. The surgical area was cleansed with alcohol. The patient's skin was then prepped and draped in a standard, sterile fashion. A time out was performed that identified the patient, the procedure, and the operative levels. All team members agreed with what was stated in the time out.   A midline incision over the spinous processes in line with the previous skin incisions. The incision was taken sharply down through skin and dermis.  Electrocautery was then used to continue the midline dissection down to the level of the spinous process. Subperiosteal dissection was performed using electrocautery to continue to expose down the spinous. Electrocautery was used to expose the pars of L5 on the left. It was followed down to the level of the L5/S1 facet joint. Fluoroscopic image was taken to confirm the proper level. Electrocautery was used the edge of the remaining lamina and facet joint. A rongeur was then used to remove the spinous process. A high speed burr was used to thin the bone underneath the previous spinous process to the level of the ligamentum. A pituitary was used to remove some of the ligamentum. A series of kerrisons were then used to remove the remaining ligamentum and bone. The dura was seen at this point. A curette was used to slowly remove the scar tissue overlying the dura. A pituitary was used to remove it. The dissection was continued laterally until the lateral edge of the right side of the thecal sac was exposed. A woodsen was used to palpate the L5 pedicle. The woodsen was then placed into the foramen. A combination  of small osteotomes were then used to remove the  bone overlying the exiting nerve root with the woodsen protect the exiting nerve root. A series of kerrisons were used to further remove bone and soft tissue along the dorsal aspect of the foramen. A curette was then used to remove furhter bone and soft tissue to open the foramen. Fluoroscopic images were taken in both the AP and lateral plane to show decompression taking place in the appropriate foramen. Continued work with a kerrison and currette was done until the curette was able to easily passed our the foramen.   The wound was copiously irrigated with sterile saline. 1g of vancomycin powder was placed into the wound. The fascia was reapproximated with 0 vicryl suture.The subcutaneous fat was reapproximated with 0 vicryl suture. The deep dermal layer was reapproximated with 2-0 vicryl. The skin as closed with a 3-0 running monocryl. All counts were correct at the end of the case. Dermabond was applied over the skin. An island dressing was placed over the wound. The patient was transferred back to a bed and brought to the post-anesthesia care unit by anesthesia staff in stable condition.  Post-operative plan: The patient will recover in the post-anesthesia care unit with a plan to go home. He will not leave until his vitals are stable, he ambulates the halls, his pain is under control, and his neuro exam is unchanged from pre-op. The patient will be out of bed as tolerated with no brace. The patient will be seen in the office approximately 2 weeks from the date of surgery.    Colette Davies, MD Orthopedic Surgeon

## 2023-09-02 NOTE — Anesthesia Procedure Notes (Signed)
 Procedure Name: Intubation Date/Time: 09/02/2023 1:45 PM  Performed by: Trenton Frock, CRNAPre-anesthesia Checklist: Patient identified, Emergency Drugs available, Suction available and Patient being monitored Patient Re-evaluated:Patient Re-evaluated prior to induction Oxygen Delivery Method: Circle system utilized Preoxygenation: Pre-oxygenation with 100% oxygen Induction Type: IV induction Ventilation: Mask ventilation without difficulty Laryngoscope Size: 4 and Mac Grade View: Grade I Tube type: Oral Tube size: 8.0 mm Number of attempts: 1 Airway Equipment and Method: Stylet and Oral airway Placement Confirmation: ETT inserted through vocal cords under direct vision, positive ETCO2 and breath sounds checked- equal and bilateral Secured at: 24 cm Tube secured with: Tape Dental Injury: Teeth and Oropharynx as per pre-operative assessment

## 2023-09-02 NOTE — Progress Notes (Signed)
 Assessment time update to 1300

## 2023-09-02 NOTE — Transfer of Care (Signed)
 Immediate Anesthesia Transfer of Care Note  Patient: Edward Stanley  Procedure(s) Performed: RIGHT LUMBAR - FIVE SACRAL ONE REVISION FORAMINOTOMY (Spine Lumbar)  Patient Location: PACU  Anesthesia Type:General  Level of Consciousness: awake, alert , and oriented  Airway & Oxygen Therapy: Patient Spontanous Breathing  Post-op Assessment: Report given to RN and Post -op Vital signs reviewed and stable  Post vital signs: Reviewed and stable  Last Vitals:  Vitals Value Taken Time  BP 145/85   Temp 98   Pulse 94 09/02/23 1926  Resp 24 09/02/23 1926  SpO2 95 % 09/02/23 1926  Vitals shown include unfiled device data.  Last Pain:  Vitals:   09/02/23 1134  PainSc: 0-No pain         Complications: No notable events documented.

## 2023-09-02 NOTE — Progress Notes (Signed)
 Orthopedic Surgery Post-operative Progress Note  Assessment: Patient is a 43 y.o. male who is currently admitted after undergoing revision right L5/S1 foraminotomy   Plan: -Operative plans complete -Drains: none -Out of bed as tolerated, no brace -No bending/lifting/twisting greater than 10 pounds -Pain control -Regular diet -No chemoprophylaxis for dvt or antiplatelets for 72 hours after surgery -Disposition: to home from PACU  ___________________________________________________________________________   Subjective: No acute events since surgery. Recovering in PACU. Pain well controlled. Not having any pain radiating into either lower extremity. No numbness or paresthesias.   Objective:  General: no acute distress, appropriate affect Neurologic: alert, answering questions appropriately, following commands Respiratory: unlabored breathing on room air Skin: dressing clear/dry/intact  MSK (spine):  -Strength exam      Right  Left  EHL    5/5  5/5 TA    5/5  5/5 GSC    5/5  5/5 Knee extension  5/5  5/5 Hip flexion   5/5  5/5  -Sensory exam    Sensation intact to light touch in L2-S1 nerve distributions of bilateral lower extremities   Patient name: Edward Stanley Patient MRN: [id number] Date: 09/02/23

## 2023-09-02 NOTE — Brief Op Note (Signed)
 09/02/2023  7:29 PM  PATIENT:  Sharmon Dec  43 y.o. male  PRE-OPERATIVE DIAGNOSIS:  LUMBAR RADICULOPATHY  POST-OPERATIVE DIAGNOSIS:  LUMBAR RADICULOPATHY  PROCEDURE:  Procedure(s) with comments: RIGHT LUMBAR - FIVE SACRAL ONE REVISION FORAMINOTOMY (N/A) - L5-S1 REVISION FORAMINOTOMY  SURGEON:  Surgeons and Role:    * Diedra Fowler, MD - Primary  PHYSICIAN ASSISTANT: none  ASSISTANTS: none   ANESTHESIA:   general  EBL:  200 mL   BLOOD ADMINISTERED:none  DRAINS: none   LOCAL MEDICATIONS USED:  NONE  SPECIMEN:  No Specimen  DISPOSITION OF SPECIMEN:  N/A  COUNTS:  YES  TOURNIQUET:  NONE  DICTATION: .Dragon Dictation  PLAN OF CARE: Discharge to home from PACU  PATIENT DISPOSITION:  PACU - hemodynamically stable.   Delay start of Pharmacological VTE agent (>24hrs) due to surgical blood loss or risk of bleeding: yes

## 2023-09-02 NOTE — Anesthesia Postprocedure Evaluation (Signed)
 Anesthesia Post Note  Patient: Edward Stanley  Procedure(s) Performed: RIGHT LUMBAR - FIVE SACRAL ONE REVISION FORAMINOTOMY (Spine Lumbar)     Patient location during evaluation: PACU Anesthesia Type: General Level of consciousness: awake and alert Pain management: pain level controlled Vital Signs Assessment: post-procedure vital signs reviewed and stable Respiratory status: spontaneous breathing, nonlabored ventilation, respiratory function stable and patient connected to nasal cannula oxygen Cardiovascular status: blood pressure returned to baseline and stable Postop Assessment: no apparent nausea or vomiting Anesthetic complications: no   No notable events documented.  Last Vitals:  Vitals:   09/02/23 1945 09/02/23 1959  BP: (!) 158/85 (!) 154/87  Pulse: 89 85  Resp: 18 17  Temp: 36.6 C   SpO2: 93% 93%    Last Pain:  Vitals:   09/02/23 1959  PainSc: 0-No pain                 Melvenia Stabs

## 2023-09-02 NOTE — Discharge Instructions (Signed)
 Orthopedic Surgery Discharge Instructions  Patient name: Edward Stanley Procedure Performed: Right L5/S1 revision foraminotomy Date of Surgery: 09/02/2023 Surgeon: Colette Davies, MD  Pre-operative Diagnosis: lumbar radiculopathy, L5/S1 foraminal stenosis  Post-operative Diagnosis: same as above  Discharged to: home Discharge Condition: stable  Activity: You should refrain from bending, lifting, or twisting with objects greater than ten pounds until six weeks after surgery. You are encouraged to walk as much as desired. You can perform household activities such as cleaning dishes, doing laundry, vacuuming, etc. as long as the ten-pound restriction is followed. You do not need to wear a brace during the post-operative period.   Incision Care: Your incision site has a dressing over it. That dressing should remain in place and dry at all times for a total of one week after surgery. After one week, you can remove the dressing. Underneath the dressing, you will find skin glue. You should leave this skin glue in place. It will fall off with time. Do not pick, rub, or scrub at it. Do not put cream or lotion over the surgical area. After one week and once the dressing is off, it is okay to let soap and water run over your incision. Again, do not pick, scrub, or rub at the skin glue when bathing. Do not submerge (e.g., take a bath, swim, go in a hot tub, etc.) until six weeks after surgery. There may be some bloody drainage from the incision into the dressing after surgery. This is normal. You do not need to replace the dressing. Continue to leave it in place for the one week as instructed above. Should the dressing become saturated with blood or drainage, please call the office for further instructions.   Medications: You have been prescribed oxycodone . This is a narcotic pain medication and should only be taken as prescribed. You should not drink alcohol or operate heavy machinery (including driving) while  taking this medication. The oxycodone  can cause constipation as a side effect. For that reason, you have been prescribed senna and miralax . These are both laxatives. You do not need to take this medication if you develop diarrhea. Should you remain constipated even while taking these medications, please increase the dose of miralax  to twice daily. Tylenol  has been prescribed to be taken every 8 hours, which will give you additional pain relief. Robaxin  is a muscle relaxer that has been prescribed to you for muscle spasm type pain. Take this medication as needed.   You should not use any NSAIDs (aspirin , meloxicam, ibuprofen, aleve, etc.) for the first 72 hours after surgery. After 72 hours, it would be fine to use these medications as instructed on the bottle. You should not take these medications if you have ulcers or kidney problems.   In order to set expectations for opioid prescriptions, you will only be prescribed opioids for a total of six weeks after surgery and, at two-weeks after surgery, your opioid prescription will start to tapered (decreased dosage and number of pills). If you have ongoing need for opioid medication six weeks after surgery, you will be referred to pain management. If you are already established with a provider that is giving you opioid medications, you should schedule an appointment with them for six weeks after surgery if you feel you are going to need another prescription. State law only allows for opioid prescriptions one week at a time. If you are running out of opioid medication near the end of the week, please call the office during business hours  before running out so I can send you another prescription.   Driving: You should not drive while taking narcotic pain medications. You should start getting back to driving slowly and you may want to try driving in a parking lot before doing anything more.   Diet: You are safe to resume your regular diet after surgery.   Reasons  to Call the Office After Surgery: You should feel free to call the office with any concerns or questions you have in the post-operative period, but you should definitely notify the office if you develop: -shortness of breath, chest pain, or trouble breathing -excessive bleeding, drainage, redness, or swelling around the surgical site -fevers, chills, or pain that is getting worse with each passing day -persistent nausea or vomiting -new weakness in either leg -new or worsening numbness or tingling in either leg -numbness in the groin, bowel or bladder incontinence -other concerns about your surgery  Follow Up Appointments: You should have an office appointment scheduled for approximately two weeks after surgery. If you do not remember when this appointment is or do not already have it scheduled, please call the office to schedule.   Office Information:  -Colette Davies, MD -Phone number: [phone] -Address: [address]       [place], [place] [address]

## 2023-09-02 NOTE — H&P (Signed)
 Orthopedic Spine Surgery H&P Note  Assessment: Patient is a 43 y.o. male with lumbar radiculopathy   Plan: -Out of bed as tolerated, activity as tolerated, no brace -Covered the risks of surgery one more time with the patient and patient elected to proceed with planned surgery -Written consent verified -Hold anticoagulation in anticipation of surgery -Ancef  and TXA on all to OR -NPO for procedure -Site marked -To OR when ready  The patient has symptoms consistent with lumbar radiculopathy. The patient's symptoms were not improving with conservative treatment so operative management was discussed in the form of right L5/S1 foraminotomy. The risks including but not limited to iatrogenic instability, dural tear, nerve root injury, paralysis, persistent pain, infection, bleeding, heart attack, death, dvt/pe, fracture, and need for additional procedures were discussed with the patient. Given the nature of it being a revision surgery, the risk of durotomy and infection would be higher. The benefit of the surgery would be improvement in the patient's radiating leg pain. I explained that back pain relief is not the goal of the surgery and it is not reliably alleviated with this surgery. The alternatives to surgical management were covered with the patient and included continued monitoring, physical therapy, over-the-counter pain medications, ambulatory aids, and activity modification. All the patient's questions were answered to his satisfaction. After this discussion, the patient expressed understanding and elected to proceed with surgical intervention.    ___________________________________________________________________________  Chief Complaint: right leg numbness and unsteadiness  History: Patient is 43 y.o. male who has been previously seen in the office for right numbness and feeling it gives out on him. Work up was consistent with lumbar radiculopathy. His symptoms failed to improve with  conservative treatment so operative management was discussed at the last office visit. The patient presents today with no changes in his symptoms since the last office visit. See previous office note for further details.    Review of systems: General: denies fevers and chills, myalgias Neurologic: denies recent changes in vision, slurred speech Abdomen: denies nausea, vomiting, hematemesis Respiratory: denies cough, shortness of breath  Past medical history: HTN GERD   Allergies: NKDA   Past surgical history:  L5/S1 microdiscectomy   Social history: Denies use of nicotine product (smoking, vaping, patches, smokeless) Alcohol use: Yes, approximately 8-10 drinks per week Denies recreational drug use  Family history: -reviewed and not pertinent to lumbar radiculopathy   Physical Exam:  BMI of 30.4  General: no acute distress, appears stated age Neurologic: alert, answering questions appropriately, following commands Cardiovascular: regular rate, no cyanosis Respiratory: unlabored breathing on room air, symmetric chest rise Psychiatric: appropriate affect, normal cadence to speech   MSK (spine):  -Strength exam      Left  Right  EHL    5/5  5/5 TA    5/5  5/5 GSC    5/5  5/5 Knee extension  5/5  5/5 Knee flexion   5/5  5/5 Hip flexion   5/5  5/5  -Sensory exam    Sensation intact to light touch in L2-S1 nerve distributions of bilateral lower extremities   Patient name: Edward Stanley Patient MRN: [id number] Date: 09/02/23

## 2023-09-03 ENCOUNTER — Encounter (HOSPITAL_COMMUNITY): Payer: Self-pay | Admitting: Orthopedic Surgery

## 2023-09-03 ENCOUNTER — Other Ambulatory Visit: Payer: Self-pay

## 2023-09-04 ENCOUNTER — Telehealth: Payer: Self-pay | Admitting: Orthopedic Surgery

## 2023-09-04 NOTE — Telephone Encounter (Signed)
 Patient called in requesting a call back; states he had surgery with Dr. Sulema Endo earlier this week and he woke up today with his bandage covered in a measurable amount of blood. Wants to know what to do for it.

## 2023-09-09 ENCOUNTER — Ambulatory Visit (INDEPENDENT_AMBULATORY_CARE_PROVIDER_SITE_OTHER): Admitting: Orthopedic Surgery

## 2023-09-09 DIAGNOSIS — Z9889 Other specified postprocedural states: Secondary | ICD-10-CM

## 2023-09-09 MED ORDER — DOXYCYCLINE HYCLATE 100 MG PO TABS: 100.0000 mg | ORAL_TABLET | Freq: Two times a day (BID) | ORAL | 0 refills | Status: AC

## 2023-09-09 NOTE — Progress Notes (Signed)
 Orthopedic Surgery Post-operative Office Visit  Procedure: L5/S1 revision right-sided foraminotomy Date of Surgery: 09/02/2023 (~1 week post op)  Assessment: Patient is a 43 y.o. whose incisional back pain has improved significantly since surgery a week ago. Still has some numbness along the posterior right thigh   Plan: -Operative plans complete -Out of bed as tolerated, no brace -No bending/lifting/twisting greater than 10 pounds -Okay to let soap/water run over the incision but do not submerge -Dressing removed today in the office -Pain management: tylenol  as needed -Return to office in 1 weeks, lumbar x-rays needed at next visit: none  ___________________________________________________________________________   Subjective: Patient has been at home since discharge from the hospital.  Still has decree sensation along the posterior aspect of his right thigh and leg.  His back pain was significant after the surgery but has gotten better over the course of the week.  He said he is just using Tylenol  for pain.  Not having any radiating leg pain.  Initially had some bloody drainage into the dressing but has not had any drainage over the last couple of days.  No redness seen around the bandages.  Objective:  General: no acute distress, appropriate affect Neurologic: alert, answering questions appropriately, following commands Respiratory: unlabored breathing on room air Skin: incision is well-approximated with Dermabond overlying it, no erythema seen, no induration seen, no active/expressible drainage  MSK (spine):  -Strength exam      Left  Right  EHL    5/5  5/5 TA    5/5  5/5 GSC    5/5  5/5 Knee extension  5/5  5/5 Hip flexion   5/5  5/5  -Sensory exam    Sensation intact to light touch in L2-S1 nerve distributions of bilateral lower extremities (decreased along the right posterior thigh and leg)  Imaging: None obtained at today's visit   Patient name: Edward Stanley Patient MRN: [id number] Date of visit: 09/09/23

## 2023-09-17 ENCOUNTER — Ambulatory Visit (INDEPENDENT_AMBULATORY_CARE_PROVIDER_SITE_OTHER): Admitting: Orthopedic Surgery

## 2023-09-17 DIAGNOSIS — Z9889 Other specified postprocedural states: Secondary | ICD-10-CM

## 2023-09-17 NOTE — Progress Notes (Signed)
 Orthopedic Surgery Post-operative Office Visit   Procedure: L5/S1 revision right-sided foraminotomy Date of Surgery: 09/02/2023 (~2 weeks post op)   Assessment: Patient is a 43 y.o. who is not having any back pain. No radiating leg pain. Numbness seems to still be present when active. Not having it as much today   Plan: -Operative plans complete -Out of bed as tolerated, no brace -No bending/lifting/twisting greater than 10 pounds -Okay to let soap/water run over the incision but do not submerge -Will continue to monitor the numbness, explained that that can take awhile (weeks-months) to recover if it ever does recover -Pain management: tylenol  as needed -Return to office in 4 weeks, lumbar x-rays needed at next visit: none   ___________________________________________________________________________     Subjective: Patient has been doing well since he was last seen in the office.  He is not having any back pain or radiating leg pain.  He has been less active since he was last seen due to the weather and has not noticed as much numbness in his leg.  However, he feels that when he is active the numbness is similar to what it was before surgery.  Has not noticed any redness or drainage around the incision.   Objective:   General: no acute distress, appropriate affect Neurologic: alert, answering questions appropriately, following commands Respiratory: unlabored breathing on room air Skin: incision is well healed, no erythema seen, no induration seen, no active/expressible drainage   MSK (spine):   -Strength exam                                                   Left                  Right   EHL                              5/5                  5/5 TA                                 5/5                  5/5 GSC                             5/5                  5/5 Knee extension            5/5                  5/5 Hip flexion                    5/5                  5/5   -Sensory  exam                           Sensation intact to light touch in L2-S1 nerve distributions of bilateral lower extremities (decreased along the right posterior thigh and leg)  Imaging: None obtained at today's visit     Patient name: Edward Stanley Patient MRN: [id number] Date of visit: 09/17/23

## 2023-10-01 ENCOUNTER — Other Ambulatory Visit: Payer: Self-pay | Admitting: *Deleted

## 2023-10-01 DIAGNOSIS — I77819 Aortic ectasia, unspecified site: Secondary | ICD-10-CM

## 2023-10-01 NOTE — Progress Notes (Signed)
 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

## 2023-10-14 ENCOUNTER — Encounter: Payer: Self-pay | Admitting: Gastroenterology

## 2023-10-14 ENCOUNTER — Ambulatory Visit (AMBULATORY_SURGERY_CENTER)

## 2023-10-14 VITALS — Ht 76.0 in | Wt 240.0 lb

## 2023-10-14 DIAGNOSIS — Z1211 Encounter for screening for malignant neoplasm of colon: Secondary | ICD-10-CM

## 2023-10-14 MED ORDER — NA SULFATE-K SULFATE-MG SULF 17.5-3.13-1.6 GM/177ML PO SOLN: 1.0000 | Freq: Once | ORAL | 0 refills | Status: AC

## 2023-10-14 NOTE — Progress Notes (Signed)

## 2023-10-16 ENCOUNTER — Encounter: Admitting: Orthopedic Surgery

## 2023-10-17 ENCOUNTER — Ambulatory Visit (INDEPENDENT_AMBULATORY_CARE_PROVIDER_SITE_OTHER): Admitting: Orthopedic Surgery

## 2023-10-17 DIAGNOSIS — M4807 Spinal stenosis, lumbosacral region: Secondary | ICD-10-CM

## 2023-10-17 NOTE — Progress Notes (Signed)
 Orthopedic Surgery Post-operative Office Visit   Procedure: L5/S1 revision right-sided foraminotomy Date of Surgery: 09/02/2023 (~6 weeks post op)   Assessment: Patient is a 43 y.o. who is not having any back pain. No radiating leg pain. Numbness has improved and is now intermittent in nature   Plan: -No spine specific restrictions, gradually return to full activity as tolerated -Okay to submerge wound at this point -Numbness has improved to some extent as it is now intermittent in nature, will continue to monitor -Pain management: tylenol  as needed -Return to office in 6 weeks, x-rays needed at next visit: AP/lateral/flex/ex lumbar   ___________________________________________________________________________     Subjective: Patient is not having any back or radiating leg pain.  He has noticed numbness along the posterior aspect of his thigh.  He sometimes will have it on the plantar aspect of his foot as well.  He said it is intermittent at this point.  He gave the example of about a week ago he was on a business trip in [place] and he had no symptoms whatsoever.  However for the last couple of days, he has had numbness along the posterior aspect of his thigh.  He said it is not pain and is just numbness.  Has not noticed any redness or drainage around his incision.     Objective:   General: no acute distress, appropriate affect Neurologic: alert, answering questions appropriately, following commands Respiratory: unlabored breathing on room air Skin: incision is well healed with no erythema, induration seen, active/expressible drainage   MSK (spine):   -Strength exam                                                   Left                  Right   EHL                              5/5                  5/5 TA                                 5/5                  5/5 GSC                             5/5                  5/5 Knee extension            5/5                  5/5 Hip  flexion                    5/5                  5/5   -Sensory exam                           Sensation intact to light touch in L2-S1  nerve distributions of bilateral lower extremities (decreased along the right posterior thigh)   Imaging: None obtained at today's visit     Patient name: Edward Stanley Patient MRN: [id number] Date of visit: 10/17/23

## 2023-10-29 ENCOUNTER — Ambulatory Visit: Admitting: Gastroenterology

## 2023-10-29 ENCOUNTER — Encounter: Payer: Self-pay | Admitting: Gastroenterology

## 2023-10-29 VITALS — BP 95/62 | HR 85 | Temp 98.2°F | Resp 13 | Ht 76.0 in | Wt 240.0 lb

## 2023-10-29 DIAGNOSIS — K635 Polyp of colon: Secondary | ICD-10-CM | POA: Diagnosis not present

## 2023-10-29 DIAGNOSIS — Z1211 Encounter for screening for malignant neoplasm of colon: Secondary | ICD-10-CM | POA: Diagnosis present

## 2023-10-29 DIAGNOSIS — D125 Benign neoplasm of sigmoid colon: Secondary | ICD-10-CM

## 2023-10-29 DIAGNOSIS — D124 Benign neoplasm of descending colon: Secondary | ICD-10-CM

## 2023-10-29 DIAGNOSIS — K648 Other hemorrhoids: Secondary | ICD-10-CM

## 2023-10-29 DIAGNOSIS — K641 Second degree hemorrhoids: Secondary | ICD-10-CM

## 2023-10-29 MED ORDER — SODIUM CHLORIDE 0.9 % IV SOLN: 500.0000 mL | INTRAVENOUS | Status: AC

## 2023-10-29 NOTE — Progress Notes (Signed)
 Report to PACU, RN, vss, BBS= Clear.

## 2023-10-29 NOTE — Progress Notes (Signed)
 Called to room to assist during endoscopic procedure.  Patient ID and intended procedure confirmed with present staff. Received instructions for my participation in the procedure from the performing physician.

## 2023-10-29 NOTE — Progress Notes (Signed)
 GASTROENTEROLOGY PROCEDURE H&P NOTE   Primary Care Physician: Beam, Lamar POUR, MD    Reason for Procedure:  Colon Cancer screening  Plan:    Colonoscopy  Patient is appropriate for endoscopic procedure(s) in the ambulatory ([facility]) setting.  The nature of the procedure, as well as the risks, benefits, and alternatives were carefully and thoroughly reviewed with the patient. Ample time for discussion and questions allowed. The patient understood, was satisfied, and agreed to proceed.     HPI: Edward Stanley is a 43 y.o. male who presents for colonoscopy for routine Colon Cancer screening.  No active GI symptoms.  No known family history of colon cancer or related malignancy.  Patient is otherwise without complaints or active issues today.  Past Medical History:  Diagnosis Date   GERD (gastroesophageal reflux disease)    HNP (herniated nucleus pulposus)    L5-S1   HTN (hypertension)     Past Surgical History:  Procedure Laterality Date   DECOMPRESSIVE LUMBAR LAMINECTOMY LEVEL 1 N/A 09/02/2023   Procedure: RIGHT LUMBAR - FIVE SACRAL ONE REVISION FORAMINOTOMY;  Surgeon: Georgina Ozell LABOR, MD;  Location: [facility] OR;  Service: Orthopedics;  Laterality: N/A;  L5-S1 REVISION FORAMINOTOMY   LAPAROSCOPIC APPENDECTOMY N/A 08/19/2014   Procedure: APPENDECTOMY LAPAROSCOPIC;  Surgeon: Donnice Lima, MD;  Location: [facility] OR;  Service: General;  Laterality: N/A;   LUMBAR LAMINECTOMY N/A 07/16/2016   Procedure: Bilateral L5-S1 MICRODISCECTOMY;  Surgeon: Lynwood FORBES Better, MD;  Location: [facility] OR;  Service: Orthopedics;  Laterality: N/A;    Prior to Admission medications   Medication Sig Start Date End Date Taking? Authorizing Provider  aspirin  EC 81 MG tablet Take 1 tablet (81 mg total) by mouth daily. Swallow whole. 08/05/23  Yes Verlin Lonni BIRCH, MD  metoprolol  succinate (TOPROL  XL) 25 MG 24 hr tablet Take 1 tablet (25 mg total) by mouth daily. 08/28/23  Yes Verlin Lonni BIRCH, MD  Multiple  Vitamin (MULTIVITAMIN WITH MINERALS) TABS tablet Take 1 tablet by mouth in the morning.   Yes [provider]  Na Sulfate-K Sulfate-Mg Sulfate concentrate (SUPREP) 17.5-3.13-1.6 GM/177ML SOLN SMARTSIG:Once 10/14/23  Yes [provider]  olmesartan-hydrochlorothiazide  (BENICAR HCT) 40-25 MG tablet Take 1 tablet by mouth in the morning. 07/03/23  Yes [provider]  omega-3 acid ethyl esters (LOVAZA) 1 g capsule Take 2 g by mouth daily.   Yes [provider]  omeprazole (PRILOSEC) 20 MG capsule Take 20 mg by mouth daily before breakfast.   Yes [provider]  rosuvastatin  (CRESTOR ) 10 MG tablet Take 1 tablet (10 mg total) by mouth daily. 08/01/23  Yes Verlin Lonni BIRCH, MD  sucralfate (CARAFATE) 1 g tablet Take 1 g by mouth 4 (four) times daily as needed (heartburn/indigestion.).   Yes [provider]    Current Outpatient Medications  Medication Sig Dispense Refill   aspirin  EC 81 MG tablet Take 1 tablet (81 mg total) by mouth daily. Swallow whole.     metoprolol  succinate (TOPROL  XL) 25 MG 24 hr tablet Take 1 tablet (25 mg total) by mouth daily. 90 tablet 3   Multiple Vitamin (MULTIVITAMIN WITH MINERALS) TABS tablet Take 1 tablet by mouth in the morning.     Na Sulfate-K Sulfate-Mg Sulfate concentrate (SUPREP) 17.5-3.13-1.6 GM/177ML SOLN SMARTSIG:Once     olmesartan-hydrochlorothiazide  (BENICAR HCT) 40-25 MG tablet Take 1 tablet by mouth in the morning.     omega-3 acid ethyl esters (LOVAZA) 1 g capsule Take 2 g by mouth daily.  omeprazole (PRILOSEC) 20 MG capsule Take 20 mg by mouth daily before breakfast.     rosuvastatin  (CRESTOR ) 10 MG tablet Take 1 tablet (10 mg total) by mouth daily. 90 tablet 3   sucralfate (CARAFATE) 1 g tablet Take 1 g by mouth 4 (four) times daily as needed (heartburn/indigestion.).     Current Facility-Administered Medications  Medication Dose Route Frequency Provider Last Rate Last Admin   0.9 %  sodium  chloride infusion  500 mL Intravenous Continuous Traver Meckes V, DO        Allergies as of 10/29/2023   (No Known Allergies)    Family History  Problem Relation Age of Onset   Heart disease Father    Breast cancer Paternal Grandmother    Heart attack Paternal Grandfather    Prostate cancer Paternal Grandfather    Colon cancer Neg Hx    Rectal cancer Neg Hx    Stomach cancer Neg Hx     Social History   Socioeconomic History   Marital status: Married    Spouse name: Not on file   Number of children: 1   Years of education: Not on file   Highest education level: Not on file  Occupational History   Occupation: Sales  Tobacco Use   Smoking status: Never   Smokeless tobacco: Never  Substance and Sexual Activity   Alcohol use: Yes    Alcohol/week: 7.0 standard drinks of alcohol    Types: 7 Cans of beer per week    Comment: daily- 2 glasses of wine or liquor   Drug use: No   Sexual activity: Not on file  Other Topics Concern   Not on file  Social History Narrative   Not on file   Social Drivers of Health   Financial Resource Strain: Low Risk  (06/27/2023)   Received from [facility]   Overall Financial Resource Strain (CARDIA)    Difficulty of Paying Living Expenses: Not hard at all  Food Insecurity: No Food Insecurity (06/27/2023)   Received from [facility]   Hunger Vital Sign    Within the past 12 months, you worried that your food would run out before you got the money to buy more.: Never true    Within the past 12 months, the food you bought just didn't last and you didn't have money to get more.: Never true  Transportation Needs: No Transportation Needs (06/27/2023)   Received from [facility] - Transportation    Lack of Transportation (Medical): No    Lack of Transportation (Non-Medical): No  Physical Activity: Sufficiently Active (06/27/2023)   Received from [facility]   Exercise Vital Sign    On average, how many days per week do you  engage in moderate to strenuous exercise (like a brisk walk)?: 5 days    On average, how many minutes do you engage in exercise at this level?: 40 min  Stress: Stress Concern Present (06/27/2023)   Received from [facility] - Occupational Stress Questionnaire    Feeling of Stress : To some extent  Social Connections: Socially Integrated (06/27/2023)   Received from [facility]   Social Network    How would you rate your social network (family, work, friends)?: Good participation with social networks  Intimate Partner Violence: Not At Risk (06/27/2023)   Received from [facility]   HITS    Over the last 12 months how often did your partner physically hurt you?:  Never    Over the last 12 months how often did your partner insult you or talk down to you?: Never    Over the last 12 months how often did your partner threaten you with physical harm?: Never    Over the last 12 months how often did your partner scream or curse at you?: Never    Physical Exam: Vital signs in last 24 hours: @BP  107/68   Pulse 80   Temp 98.2 F (36.8 C)   Resp 16   Ht 6' 4 (1.93 m)   Wt 240 lb (108.9 kg)   SpO2 99%   BMI 29.21 kg/m  GEN: NAD EYE: Sclerae anicteric ENT: MMM CV: Non-tachycardic Pulm: CTA b/l GI: Soft, NT/ND NEURO:  Alert & Oriented x 3   Sandor Flatter, DO [facility]   10/29/2023 10:20 AM

## 2023-10-29 NOTE — Patient Instructions (Addendum)
 YOU HAD AN ENDOSCOPIC PROCEDURE TODAY AT THE [facility]:   Refer to the procedure report that was given to you for any specific questions about what was found during the examination.  If the procedure report does not answer your questions, please call your gastroenterologist to clarify.  If you requested that your care partner not be given the details of your procedure findings, then the procedure report has been included in a sealed envelope for you to review at your convenience later.  YOU SHOULD EXPECT: Some feelings of bloating in the abdomen. Passage of more gas than usual.  Walking can help get rid of the air that was put into your GI tract during the procedure and reduce the bloating. If you had a lower endoscopy (such as a colonoscopy or flexible sigmoidoscopy) you may notice spotting of blood in your stool or on the toilet paper. If you underwent a bowel prep for your procedure, you may not have a normal bowel movement for a few days.  Please Note:  You might notice some irritation and congestion in your nose or some drainage.  This is from the oxygen used during your procedure.  There is no need for concern and it should clear up in a day or so.  SYMPTOMS TO REPORT IMMEDIATELY:  Following lower endoscopy (colonoscopy or flexible sigmoidoscopy):  Excessive amounts of blood in the stool  Significant tenderness or worsening of abdominal pains  Swelling of the abdomen that is new, acute  Fever of 100F or higher   For urgent or emergent issues, a gastroenterologist can be reached at any hour by calling [phone]. Do not use MyChart messaging for urgent concerns.    DIET:  We do recommend a small meal at first, but then you may proceed to your regular diet.  Drink plenty of fluids but you should avoid alcoholic beverages for 24 hours.  MEDICATIONS: Continue present medications. Increase Omeprazole to 20 mg by mouth twice daily, 30 minutes before breakfast and  dinner.  FOLLOW UP: Await pathology results. Repeat colonoscopy for surveillance based on pathology results. Return to GI clinic as needed.  Please see handouts given to you by your recovery nurse: Polyps, Hemorrhoids.  Thank you for allowing us  to provide for your healthcare needs today.  ACTIVITY:  You should plan to take it easy for the rest of today and you should NOT DRIVE or use heavy machinery until tomorrow (because of the sedation medicines used during the test).    FOLLOW UP: Our staff will call the number listed on your records the next business day following your procedure.  We will call around 7:15- 8:00 am to check on you and address any questions or concerns that you may have regarding the information given to you following your procedure. If we do not reach you, we will leave a message.     If any biopsies were taken you will be contacted by phone or by letter within the next 1-3 weeks.  Please call us  at [phone] if you have not heard about the biopsies in 3 weeks.    SIGNATURES/CONFIDENTIALITY: You and/or your care partner have signed paperwork which will be entered into your electronic medical record.  These signatures attest to the fact that that the information above on your After Visit Summary has been reviewed and is understood.  Full responsibility of the confidentiality of this discharge information lies with you and/or your care-partner.

## 2023-10-29 NOTE — Op Note (Signed)
 [facility] Patient Name: Edward Stanley Procedure Date: 10/29/2023 10:16 AM MRN: [id number] Endoscopist: Sandor Flatter , MD, [id number] Age: 43 Referring MD:  Date of Birth: [birth year] Gender: Male Account #: [id number] Procedure:                Colonoscopy Indications:              Screening for colorectal malignant neoplasm, This                            is the patient's first colonoscopy Medicines:                Monitored Anesthesia Care Procedure:                Pre-Anesthesia Assessment:                           - Prior to the procedure, a History and Physical                            was performed, and patient medications and                            allergies were reviewed. The patient's tolerance of                            previous anesthesia was also reviewed. The risks                            and benefits of the procedure and the sedation                            options and risks were discussed with the patient.                            All questions were answered, and informed consent                            was obtained. Prior Anticoagulants: The patient has                            taken no anticoagulant or antiplatelet agents. ASA                            Grade Assessment: II - A patient with mild systemic                            disease. After reviewing the risks and benefits,                            the patient was deemed in satisfactory condition to                            undergo the procedure.  After obtaining informed consent, the colonoscope                            was passed under direct vision. Throughout the                            procedure, the patient's blood pressure, pulse, and                            oxygen saturations were monitored continuously. The                            CF [id number] #[id number] was introduced through the anus                            and advanced to the the  terminal ileum. The                            colonoscopy was performed without difficulty. The                            patient tolerated the procedure well. The quality                            of the bowel preparation was good. The terminal                            ileum, ileocecal valve, appendiceal orifice, and                            rectum were photographed. Scope In: 10:27:57 AM Scope Out: 10:46:14 AM Scope Withdrawal Time: 0 hours 15 minutes 34 seconds  Total Procedure Duration: 0 hours 18 minutes 17 seconds  Findings:                 The perianal and digital rectal examinations were                            normal.                           Four sessile polyps were found in the sigmoid colon                            and descending colon. The polyps were 2 to 6 mm in                            size. These polyps were removed with a cold snare.                            Resection and retrieval were complete. Estimated                            blood loss was minimal.  Non-bleeding internal hemorrhoids were found during                            retroflexion. The hemorrhoids were small.                           The exam was otherwise normal throughout the                            remainder of the colon.                           The terminal ileum appeared normal. Complications:            No immediate complications. Estimated Blood Loss:     Estimated blood loss was minimal. Impression:               - Four 2 to 6 mm polyps in the sigmoid colon and in                            the descending colon, removed with a cold snare.                            Resected and retrieved.                           - Non-bleeding internal hemorrhoids.                           - The examined portion of the ileum was normal. Recommendation:           - Patient has a contact number available for                            emergencies. The signs and  symptoms of potential                            delayed complications were discussed with the                            patient. Return to normal activities tomorrow.                            Written discharge instructions were provided to the                            patient.                           - Resume previous diet.                           - Continue present medications.                           - Await pathology results.                           -  Repeat colonoscopy for surveillance based on                            pathology results.                           - Return to GI office PRN. Sandor Flatter, MD 10/29/2023 10:51:16 AM

## 2023-10-30 ENCOUNTER — Telehealth: Payer: Self-pay

## 2023-10-30 NOTE — Telephone Encounter (Signed)
 Follow up call to pt, lm for pt to call if having any difficulty with normal activities or eating and drinking.  Also to call if any other questions or concerns.

## 2023-10-31 LAB — SURGICAL PATHOLOGY

## 2023-11-03 ENCOUNTER — Ambulatory Visit: Payer: Self-pay | Admitting: Gastroenterology

## 2023-11-27 ENCOUNTER — Other Ambulatory Visit (INDEPENDENT_AMBULATORY_CARE_PROVIDER_SITE_OTHER)

## 2023-11-27 ENCOUNTER — Ambulatory Visit (INDEPENDENT_AMBULATORY_CARE_PROVIDER_SITE_OTHER): Admitting: Orthopedic Surgery

## 2023-11-27 DIAGNOSIS — Z9889 Other specified postprocedural states: Secondary | ICD-10-CM

## 2023-11-27 NOTE — Progress Notes (Signed)
 Orthopedic Surgery Post-operative Office Visit   Procedure: L5/S1 revision right-sided foraminotomy Date of Surgery: 09/02/2023 (~3 months post op)   Assessment: Patient is a 43 y.o. who is not having any back pain. No radiating leg pain.  Numbness is now intermittent to the point that he notices it once a week for couple hours   Plan: -Activity as tolerated, no restrictions -Patient is doing well at this point, so we will continue to monitor.  Explained that he may notice continued improvement with time -Pain management: tylenol  as needed -Return to office in 3 months, x-rays needed at next visit: none   ___________________________________________________________________________     Subjective: Patient has noticed improvement since he was last seen in the office.  He does still have some numbness along the posterior aspect of his thigh.  He said it is mostly when he is sitting for couple hours.  Its intermittent nature.  He said he noticed it about once a week.  He is not limited in any of the activities that he wants to do.  Is been traveling extensively for work and has to do a lot of walking.  He also has been to the beach and did not notice any issues or limitations when they are with his family.   Objective:   General: no acute distress, appropriate affect Neurologic: alert, answering questions appropriately, following commands Respiratory: unlabored breathing on room air Skin: incision is well healed   MSK (spine):   -Strength exam                                                   Left                  Right   EHL                              5/5                  5/5 TA                                 5/5                  5/5 GSC                             5/5                  5/5 Knee extension            5/5                  5/5 Hip flexion                    5/5                  5/5   -Sensory exam                           Sensation intact to light touch in L2-S1  nerve distributions of bilateral lower extremities    Imaging: XRs of the lumbar spine  from 11/27/2023 were independently reviewed interpreted, showing disc height loss at L5/S1.  No other significant degenerative changes.  No evidence of instability on flexion/extension views.  No fracture or dislocation seen.     Patient name: Edward Stanley Patient MRN: [id number] Date of visit: 11/27/23

## 2024-02-23 ENCOUNTER — Encounter: Payer: Self-pay | Admitting: Radiology

## 2024-03-11 ENCOUNTER — Ambulatory Visit: Admitting: Orthopedic Surgery

## 2024-03-11 DIAGNOSIS — M48061 Spinal stenosis, lumbar region without neurogenic claudication: Secondary | ICD-10-CM | POA: Diagnosis not present

## 2024-03-11 NOTE — Progress Notes (Signed)
 Orthopedic Surgery Post-operative Office Visit   Procedure: L5/S1 revision right-sided foraminotomy Date of Surgery: 09/02/2023 (~6 months post op)   Assessment: Patient is a 43 y.o. who is doing well after surgery   Plan: -Activity as tolerated, no restrictions -Tylenol  as needed for pain relief -Return to office on an as needed basis   ___________________________________________________________________________     Subjective: Patient has been doing well since he was last in the office.  He is not having any back or radiating leg pain.  He does notice if he sitting for a prolonged period of time that he will develop numbness over the posterior aspect of his thigh. He states that he notices this if he is sitting for greater than 3 hours.  It resolves immediately after he gets up.  He has returned to full activities.  He said he played golf on Monday.  He did not notice any numbness or tingling in his leg or back pain.  This is the first time that he has been able to play golf like that in a couple of years.  He is pleased with how he is doing at this point.    Objective:   General: no acute distress, appropriate affect Neurologic: alert, answering questions appropriately, following commands Respiratory: unlabored breathing on room air Skin: incision is well healed   MSK (spine):   -Strength exam                                                   Left                  Right   EHL                              5/5                  5/5 TA                                 5/5                  5/5 GSC                             5/5                  5/5 Knee extension            5/5                  5/5 Hip flexion                    5/5                  5/5   -Sensory exam                           Sensation intact to light touch in L2-S1 nerve distributions of bilateral lower extremities    Imaging: XRs of the lumbar spine from 11/27/2023 were previously independently reviewed  interpreted, showing disc height loss at L5/S1.  No other significant degenerative changes.  No evidence of instability on flexion/extension views.  No fracture or dislocation seen.     Patient name: Edward Stanley Patient MRN: [id number] Date of visit: 03/11/24

## 2024-04-13 ENCOUNTER — Telehealth: Payer: Self-pay | Admitting: Cardiovascular Disease

## 2024-04-13 NOTE — Telephone Encounter (Signed)
" °  Per MyChart scheduling message:  Pt c/o BP issue: STAT if pt c/o blurred vision, one-sided weakness or slurred speech.  STAT if BP is GREATER than 180/120 TODAY.  STAT if BP is LESS than 90/60 and SYMPTOMATIC TODAY  1. What is your BP concern?   2. Have you taken any BP medication today?  3. What are your last 5 BP readings?  4. Are you having any other symptoms (ex. Dizziness, headache, blurred vision, passed out)?   Blood pressure fluctuates between 120-135 but bottom number has stayed consistent around 80.  Light headedness has always been my cue for high blood pressure.  I recently changed meds with my primary to Valsartan HCTZ 320-25mg  and Nebivolol 10 mg due to high readings.  The lightheartedness has not stopped as it usually does after lowering blood pressure. No dizziness or passing out but yes blurred vision at times and light headedness   "

## 2024-04-13 NOTE — Telephone Encounter (Signed)
Left message to call back with his concerns.

## 2024-04-20 NOTE — Telephone Encounter (Signed)
 Attempted to contact pt.  Left generic message to call back or send a MyChart message if he is still having issues with his blood pressure.  Requested if his PCP made the changes in his medications to please follow up with that provider.  Will close this encounter for now and await a call back based on pt's needs.

## 2024-04-21 NOTE — Telephone Encounter (Signed)
 Patient reached out again via pt schedule, requesting a callback.

## 2024-04-21 NOTE — Telephone Encounter (Signed)
 Patient states he has already reached out to Dr. Verlin regarding his BP concerns and that Dr. Verlin will reach out to him when he returns to work next week.  Patient states nothing is needed at this time and he will wait to hear back from Dr. Verlin next week, he expressed appreciation for follow-up.

## 2024-04-26 MED ORDER — AMLODIPINE BESYLATE 5 MG PO TABS: 5.0000 mg | ORAL_TABLET | Freq: Every day | ORAL | 2 refills | Status: AC

## 2024-04-26 NOTE — Addendum Note (Signed)
 Addended by: Sharry Beining L on: 04/26/2024 01:30 PM   Modules accepted: Orders

## 2024-04-26 NOTE — Telephone Encounter (Signed)
 Prescription sent to [facility]

## 2024-07-26 ENCOUNTER — Other Ambulatory Visit (HOSPITAL_COMMUNITY)
